# Patient Record
Sex: Male | Born: 1935 | Race: White | Hispanic: No | Smoking: Former smoker
Health system: Southern US, Community
[De-identification: ages and names within clinical notes are randomized; demographics above are authoritative.]

## PROBLEM LIST (undated history)

## (undated) DIAGNOSIS — I639 Cerebral infarction, unspecified: Secondary | ICD-10-CM

## (undated) DIAGNOSIS — E538 Deficiency of other specified B group vitamins: Secondary | ICD-10-CM

## (undated) DIAGNOSIS — I499 Cardiac arrhythmia, unspecified: Secondary | ICD-10-CM

## (undated) DIAGNOSIS — I10 Essential (primary) hypertension: Secondary | ICD-10-CM

## (undated) DIAGNOSIS — M4800 Spinal stenosis, site unspecified: Secondary | ICD-10-CM

## (undated) DIAGNOSIS — K5790 Diverticulosis of intestine, part unspecified, without perforation or abscess without bleeding: Secondary | ICD-10-CM

## (undated) DIAGNOSIS — I714 Abdominal aortic aneurysm, without rupture, unspecified: Secondary | ICD-10-CM

## (undated) DIAGNOSIS — I509 Heart failure, unspecified: Secondary | ICD-10-CM

## (undated) DIAGNOSIS — E119 Type 2 diabetes mellitus without complications: Secondary | ICD-10-CM

## (undated) HISTORY — PX: CHOLECYSTECTOMY: SHX55

## (undated) HISTORY — PX: BACK SURGERY: SHX140

## (undated) HISTORY — DX: Diverticulosis of intestine, part unspecified, without perforation or abscess without bleeding: K57.90

## (undated) HISTORY — DX: Cardiac arrhythmia, unspecified: I49.9

## (undated) HISTORY — DX: Spinal stenosis, site unspecified: M48.00

## (undated) HISTORY — DX: Abdominal aortic aneurysm, without rupture, unspecified: I71.40

## (undated) HISTORY — DX: Heart failure, unspecified: I50.9

## (undated) HISTORY — DX: Deficiency of other specified B group vitamins: E53.8

---

## 2006-02-01 ENCOUNTER — Ambulatory Visit: Payer: Self-pay | Admitting: Unknown Physician Specialty

## 2006-04-15 ENCOUNTER — Ambulatory Visit: Payer: Self-pay | Admitting: Gastroenterology

## 2007-01-24 ENCOUNTER — Other Ambulatory Visit: Payer: Self-pay

## 2007-01-24 ENCOUNTER — Ambulatory Visit: Payer: Self-pay | Admitting: Orthopedic Surgery

## 2007-01-31 ENCOUNTER — Ambulatory Visit: Payer: Self-pay | Admitting: Orthopedic Surgery

## 2007-02-15 ENCOUNTER — Ambulatory Visit: Payer: Self-pay | Admitting: Orthopedic Surgery

## 2010-02-10 ENCOUNTER — Ambulatory Visit: Payer: Self-pay | Admitting: Unknown Physician Specialty

## 2010-03-27 ENCOUNTER — Ambulatory Visit: Payer: Self-pay | Admitting: Anesthesiology

## 2010-04-30 ENCOUNTER — Ambulatory Visit: Payer: Self-pay | Admitting: Anesthesiology

## 2010-06-30 ENCOUNTER — Ambulatory Visit: Payer: Self-pay | Admitting: Anesthesiology

## 2010-07-24 ENCOUNTER — Ambulatory Visit: Payer: Self-pay | Admitting: Anesthesiology

## 2010-08-21 ENCOUNTER — Ambulatory Visit: Payer: Self-pay | Admitting: Anesthesiology

## 2011-04-13 ENCOUNTER — Ambulatory Visit: Payer: Self-pay | Admitting: Surgery

## 2011-06-09 ENCOUNTER — Ambulatory Visit: Payer: Self-pay | Admitting: Specialist

## 2011-06-14 ENCOUNTER — Ambulatory Visit: Payer: Self-pay | Admitting: Specialist

## 2011-06-16 LAB — PATHOLOGY REPORT

## 2012-06-20 ENCOUNTER — Ambulatory Visit: Payer: Self-pay | Admitting: Ophthalmology

## 2012-06-20 LAB — POTASSIUM: Potassium: 3.6 mmol/L (ref 3.5–5.1)

## 2012-07-04 ENCOUNTER — Ambulatory Visit: Payer: Self-pay | Admitting: Ophthalmology

## 2012-08-10 ENCOUNTER — Ambulatory Visit: Payer: Self-pay | Admitting: Ophthalmology

## 2012-08-22 ENCOUNTER — Ambulatory Visit: Payer: Self-pay | Admitting: Ophthalmology

## 2013-04-12 ENCOUNTER — Ambulatory Visit: Payer: Self-pay

## 2013-05-08 ENCOUNTER — Ambulatory Visit: Payer: Self-pay | Admitting: Gastroenterology

## 2013-05-10 LAB — PATHOLOGY REPORT

## 2013-10-04 ENCOUNTER — Ambulatory Visit: Payer: Self-pay

## 2015-02-18 NOTE — Op Note (Signed)
PATIENT NAME:  Ivan Lewis, Ivan Lewis MR#:  [id number] DATE OF BIRTH:  Mar 30, 1935  DATE OF PROCEDURE:  08/22/2012  PREOPERATIVE DIAGNOSIS: Visually significant cataract of the right eye.   POSTOPERATIVE DIAGNOSIS: Visually significant cataract of the right eye.   OPERATIVE PROCEDURE: Cataract extraction by phacoemulsification with implant of intraocular lens to right eye.   SURGEON: Galen ManilaWilliam Haliey Romberg, MD.   ANESTHESIA:  1. Managed anesthesia care.  2. Topical tetracaine drops followed by 2% Xylocaine jelly applied in the preoperative holding area.   COMPLICATIONS: None.   TECHNIQUE:  Stop and chop.   DESCRIPTION OF PROCEDURE: The patient was examined and consented in the preoperative holding area where the aforementioned topical anesthesia was applied to the right eye and then brought back to the Operating Room where the right eye was prepped and draped in the usual sterile ophthalmic fashion and a lid speculum was placed. A paracentesis was created with the side port blade and the anterior chamber was filled with viscoelastic. A near clear corneal incision was performed with the steel keratome. A continuous curvilinear capsulorrhexis was performed with a cystotome followed by the capsulorrhexis forceps. Hydrodissection and hydrodelineation were carried out with BSS on a blunt cannula. The lens was removed in a stop and chop technique and the remaining cortical material was removed with the irrigation-aspiration handpiece. The capsular bag was inflated with viscoelastic and the Tecnis ZCB00 20.5-diopter lens, serial number [id number] was placed in the capsular bag without complication. The remaining viscoelastic was removed from the eye with the irrigation-aspiration handpiece. The wounds were hydrated. The anterior chamber was flushed with Miostat and the eye was inflated to physiologic pressure. The wounds were found to be water tight. The eye was dressed with Vigamox. The patient was given protective  glasses to wear throughout the day and a shield with which to sleep tonight. The patient was also given drops with which to begin a drop regimen today and will follow-up with me in one day.  ____________________________ Jerilee FieldWilliam L. Raynelle Fujikawa, MD wlp:slb Lewis: 08/22/2012 18:30:00 ET T: 08/23/2012 07:27:51 ET JOB#: [id number]  cc: Kenniel Bergsma L. Jerrie Schussler, MD, <Dictator> Jerilee FieldWILLIAM L Nataline Basara MD ELECTRONICALLY SIGNED 08/23/2012 15:17

## 2015-02-18 NOTE — Op Note (Signed)
PATIENT NAME:  Ivan HeringDONLEY, [name] MR#:  [id number] DATE OF BIRTH:  Oct 20, 1935  DATE OF PROCEDURE:  07/04/2012  PREOPERATIVE DIAGNOSIS: Visually significant cataract of the left eye.   POSTOPERATIVE DIAGNOSIS: Visually significant cataract of the left eye.   OPERATIVE PROCEDURE: Cataract extraction by phacoemulsification with implant of intraocular lens to left eye.   SURGEON: Galen ManilaWilliam Illianna Paschal, MD.   ANESTHESIA:  1. Managed anesthesia care.  2. Topical tetracaine drops followed by 2% Xylocaine jelly applied in the preoperative holding area.   COMPLICATIONS: None.   TECHNIQUE:  Stop and chop.   DESCRIPTION OF PROCEDURE: The patient was examined and consented in the preoperative holding area where the aforementioned topical anesthesia was applied to the left eye and then brought back to the Operating Room where the left eye was prepped and draped in the usual sterile ophthalmic fashion and a lid speculum was placed. A paracentesis was created with the side port blade and the anterior chamber was filled with viscoelastic. A near clear corneal incision was performed with the steel keratome. A continuous curvilinear capsulorrhexis was performed with a cystotome followed by the capsulorrhexis forceps. Hydrodissection and hydrodelineation were carried out with BSS on a blunt cannula. The lens was removed in a stop and chop technique and the remaining cortical material was removed with the irrigation-aspiration handpiece. The capsular bag was inflated with viscoelastic and the Technis ZCB00 20.5-diopter lens, serial number [id number] was placed in the capsular bag without complication. The remaining viscoelastic was removed from the eye with the irrigation-aspiration handpiece. The wounds were hydrated. The anterior chamber was flushed with Miostat and the eye was inflated to physiologic pressure. The wounds were found to be water tight. The eye was dressed with Vigamox. The patient was given protective  glasses to wear throughout the day and a shield with which to sleep tonight. The patient was also given drops with which to begin a drop regimen today and will follow-up with me in one day.   ____________________________ Jerilee FieldWilliam L. Idan Prime, MD wlp:cms D: 07/04/2012 12:24:48 ET T: 07/04/2012 12:45:56 ET JOB#: [id number]  cc: Louanna Vanliew L. Jazia Faraci, MD, <Dictator> Jerilee FieldWILLIAM L Dandra Shambaugh MD ELECTRONICALLY SIGNED 07/05/2012 13:06

## 2015-08-17 ENCOUNTER — Encounter: Payer: Self-pay | Admitting: Emergency Medicine

## 2015-08-17 ENCOUNTER — Emergency Department: Payer: Medicare Other

## 2015-08-17 ENCOUNTER — Emergency Department
Admission: EM | Admit: 2015-08-17 | Discharge: 2015-08-17 | Disposition: A | Discharge status: Home / Self Care | Payer: Medicare Other | Attending: Emergency Medicine | Admitting: Emergency Medicine

## 2015-08-17 DIAGNOSIS — J9801 Acute bronchospasm: Secondary | ICD-10-CM | POA: Insufficient documentation

## 2015-08-17 DIAGNOSIS — E119 Type 2 diabetes mellitus without complications: Secondary | ICD-10-CM | POA: Insufficient documentation

## 2015-08-17 DIAGNOSIS — R911 Solitary pulmonary nodule: Secondary | ICD-10-CM | POA: Diagnosis not present

## 2015-08-17 DIAGNOSIS — I10 Essential (primary) hypertension: Secondary | ICD-10-CM | POA: Diagnosis not present

## 2015-08-17 DIAGNOSIS — J181 Lobar pneumonia, unspecified organism: Secondary | ICD-10-CM | POA: Insufficient documentation

## 2015-08-17 DIAGNOSIS — J189 Pneumonia, unspecified organism: Secondary | ICD-10-CM

## 2015-08-17 DIAGNOSIS — R918 Other nonspecific abnormal finding of lung field: Secondary | ICD-10-CM

## 2015-08-17 DIAGNOSIS — Z87891 Personal history of nicotine dependence: Secondary | ICD-10-CM | POA: Diagnosis not present

## 2015-08-17 DIAGNOSIS — Z880 Allergy status to penicillin: Secondary | ICD-10-CM | POA: Insufficient documentation

## 2015-08-17 DIAGNOSIS — R05 Cough: Secondary | ICD-10-CM | POA: Diagnosis present

## 2015-08-17 HISTORY — DX: Essential (primary) hypertension: I10

## 2015-08-17 HISTORY — DX: Cerebral infarction, unspecified: I63.9

## 2015-08-17 HISTORY — DX: Type 2 diabetes mellitus without complications: E11.9

## 2015-08-17 LAB — CBC WITH DIFFERENTIAL/PLATELET
BASOS PCT: 1 %
Basophils Absolute: 0.1 10*3/uL (ref 0–0.1)
Eosinophils Absolute: 0.1 10*3/uL (ref 0–0.7)
Eosinophils Relative: 0 %
HEMATOCRIT: 39.9 % — AB (ref 40.0–52.0)
HEMOGLOBIN: 13.5 g/dL (ref 13.0–18.0)
Lymphocytes Relative: 9 %
Lymphs Abs: 1.6 10*3/uL (ref 1.0–3.6)
MCH: 29.8 pg (ref 26.0–34.0)
MCHC: 33.9 g/dL (ref 32.0–36.0)
MCV: 87.9 fL (ref 80.0–100.0)
Monocytes Absolute: 1.2 10*3/uL — ABNORMAL HIGH (ref 0.2–1.0)
Monocytes Relative: 7 %
NEUTROS ABS: 14.4 10*3/uL — AB (ref 1.4–6.5)
NEUTROS PCT: 83 %
Platelets: 263 10*3/uL (ref 150–440)
RBC: 4.54 MIL/uL (ref 4.40–5.90)
RDW: 13.7 % (ref 11.5–14.5)
WBC: 17.3 10*3/uL — AB (ref 3.8–10.6)

## 2015-08-17 LAB — BASIC METABOLIC PANEL
ANION GAP: 9 (ref 5–15)
BUN: 15 mg/dL (ref 6–20)
CALCIUM: 9.2 mg/dL (ref 8.9–10.3)
CO2: 30 mmol/L (ref 22–32)
Chloride: 95 mmol/L — ABNORMAL LOW (ref 101–111)
Creatinine, Ser: 0.77 mg/dL (ref 0.61–1.24)
GFR calc non Af Amer: >60 mL/min (ref >60)
Glucose, Bld: 135 mg/dL — ABNORMAL HIGH (ref 65–99)
Potassium: 3.6 mmol/L (ref 3.5–5.1)
SODIUM: 134 mmol/L — AB (ref 135–145)

## 2015-08-17 MED ORDER — ALBUTEROL SULFATE HFA 108 (90 BASE) MCG/ACT IN AERS: 2.0000 | INHALATION_SPRAY | Freq: Four times a day (QID) | RESPIRATORY_TRACT | Status: DC | PRN

## 2015-08-17 MED ORDER — AZITHROMYCIN 250 MG PO TABS
500.0000 mg | ORAL_TABLET | Freq: Once | ORAL | Status: AC
  Administered 2015-08-17: 500 mg via ORAL

## 2015-08-17 MED ORDER — AZITHROMYCIN 250 MG PO TABS
ORAL_TABLET | ORAL | Status: AC
  Administered 2015-08-17: 500 mg via ORAL
  Filled 2015-08-17: qty 2

## 2015-08-17 MED ORDER — DEXTROSE 5 % IV SOLN
1.0000 g | Freq: Once | INTRAVENOUS | Status: AC
  Administered 2015-08-17: 1 g via INTRAVENOUS
  Filled 2015-08-17 (×2): qty 10

## 2015-08-17 MED ORDER — IOHEXOL 300 MG/ML  SOLN
75.0000 mL | Freq: Once | INTRAMUSCULAR | Status: AC | PRN
  Administered 2015-08-17: 75 mL via INTRAVENOUS
  Filled 2015-08-17: qty 75

## 2015-08-17 MED ORDER — IPRATROPIUM-ALBUTEROL 0.5-2.5 (3) MG/3ML IN SOLN
3.0000 mL | Freq: Once | RESPIRATORY_TRACT | Status: AC
  Administered 2015-08-17: 3 mL via RESPIRATORY_TRACT
  Filled 2015-08-17 (×2): qty 3

## 2015-08-17 MED ORDER — AZITHROMYCIN 250 MG PO TABS: ORAL_TABLET | ORAL | Status: DC

## 2015-08-17 NOTE — Discharge Instructions (Signed)
Your found to have a left-sided pneumonia, and were given a 24-hour dose of Rocephin antibiotic IV in the emergency department. You're also given your first dose of azithromycin here in the emergency department. Start your additional azithromycin tablets tomorrow. You're being treated with albuterol inhaler for wheezing, as needed.  Return to the emergency department for any worsening condition including trouble breathing, chest pain, confusion or altered mental status, or any other symptoms concerning to you.  We discussed your CT scan showed 2 right sided small pulmonary nodules, which may need a repeat CT scan in several months. Discuss this with your primary care physician.   Community-Acquired Pneumonia, Adult Pneumonia is an infection of the lungs. There are different types of pneumonia. One type can develop while a person is in a hospital. A different type, called community-acquired pneumonia, develops in people who are not, or have not recently been, in the hospital or other health care facility.  CAUSES Pneumonia may be caused by bacteria, viruses, or funguses. Community-acquired pneumonia is often caused by Streptococcus pneumonia bacteria. These bacteria are often passed from one person to another by breathing in droplets from the cough or sneeze of an infected person. RISK FACTORS The condition is more likely to develop in:  People who havechronic diseases, such as chronic obstructive pulmonary disease (COPD), asthma, congestive heart failure, cystic fibrosis, diabetes, or kidney disease.  People who haveearly-stage or late-stage HIV.  People who havesickle cell disease.  People who havehad their spleen removed (splenectomy).  People who havepoor Administrator.  People who havemedical conditions that increase the risk of breathing in (aspirating) secretions their own mouth and nose.   People who havea weakened immune system (immunocompromised).  People who  smoke.  People whotravel to areas where pneumonia-causing germs commonly exist.  People whoare around animal habitats or animals that have pneumonia-causing germs, including birds, bats, rabbits, cats, and farm animals. SYMPTOMS Symptoms of this condition include:  Adry cough.  A wet (productive) cough.  Fever.  Sweating.  Chest pain, especially when breathing deeply or coughing.  Rapid breathing or difficulty breathing.  Shortness of breath.  Shaking chills.  Fatigue.  Muscle aches. DIAGNOSIS Your health care provider will take a medical history and perform a physical exam. You may also have other tests, including:  Imaging studies of your chest, including X-rays.  Tests to check your blood oxygen level and other blood gases.  Other tests on blood, mucus (sputum), fluid around your lungs (pleural fluid), and urine. If your pneumonia is severe, other tests may be done to identify the specific cause of your illness. TREATMENT The type of treatment that you receive depends on many factors, such as the cause of your pneumonia, the medicines you take, and other medical conditions that you have. For most adults, treatment and recovery from pneumonia may occur at home. In some cases, treatment must happen in a hospital. Treatment may include:  Antibiotic medicines, if the pneumonia was caused by bacteria.  Antiviral medicines, if the pneumonia was caused by a virus.  Medicines that are given by mouth or through an IV tube.  Oxygen.  Respiratory therapy. Although rare, treating severe pneumonia may include:  Mechanical ventilation. This is done if you are not breathing well on your own and you cannot maintain a safe blood oxygen level.  Thoracentesis. This procedureremoves fluid around one lung or both lungs to help you breathe better. HOME CARE INSTRUCTIONS  Take over-the-counter and prescription medicines only as told by your health  care provider.  Only  takecough medicine if you are losing sleep. Understand that cough medicine can prevent your body's natural ability to remove mucus from your lungs.  If you were prescribed an antibiotic medicine, take it as told by your health care provider. Do not stop taking the antibiotic even if you start to feel better.  Sleep in a semi-upright position at night. Try sleeping in a reclining chair, or place a few pillows under your head.  Do not use tobacco products, including cigarettes, chewing tobacco, and e-cigarettes. If you need help quitting, ask your health care provider.  Drink enough water to keep your urine clear or pale yellow. This will help to thin out mucus secretions in your lungs. PREVENTION There are ways that you can decrease your risk of developing community-acquired pneumonia. Consider getting a pneumococcal vaccine if:  You are older than 78 years of age.  You are older than 78 years of age and are undergoing cancer treatment, have chronic lung disease, or have other medical conditions that affect your immune system. Ask your health care provider if this applies to you. There are different types and schedules of pneumococcal vaccines. Ask your health care provider which vaccination option is best for you. You may also prevent community-acquired pneumonia if you take these actions:  Get an influenza vaccine every year. Ask your health care provider which type of influenza vaccine is best for you.  Go to the dentist on a regular basis.  Wash your hands often. Use hand sanitizer if soap and water are not available. SEEK MEDICAL CARE IF:  You have a fever.  You are losing sleep because you cannot control your cough with cough medicine. SEEK IMMEDIATE MEDICAL CARE IF:  You have worsening shortness of breath.  You have increased chest pain.  Your sickness becomes worse, especially if you are an older adult or have a weakened immune system.  You cough up blood.   This  information is not intended to replace advice given to you by your health care provider. Make sure you discuss any questions you have with your health care provider.   Document Released: 10/18/2005 Document Revised: 07/09/2015 Document Reviewed: 02/12/2015 Elsevier Interactive Patient Education 2016 [employer]   Bronchospasm, Adult A bronchospasm is a spasm or tightening of the airways going into the lungs. During a bronchospasm breathing becomes more difficult because the airways get smaller. When this happens there can be coughing, a whistling sound when breathing (wheezing), and difficulty breathing. Bronchospasm is often associated with asthma, but not all patients who experience a bronchospasm have asthma. CAUSES  A bronchospasm is caused by inflammation or irritation of the airways. The inflammation or irritation may be triggered by:   Allergies (such as to animals, pollen, food, or mold). Allergens that cause bronchospasm may cause wheezing immediately after exposure or many hours later.   Infection. Viral infections are believed to be the most common cause of bronchospasm.   Exercise.   Irritants (such as pollution, cigarette smoke, strong odors, aerosol sprays, and paint fumes).   Weather changes. Winds increase molds and pollens in the air. Rain refreshes the air by washing irritants out. Cold air may cause inflammation.   Stress and emotional upset.  SIGNS AND SYMPTOMS   Wheezing.   Excessive nighttime coughing.   Frequent or severe coughing with a simple cold.   Chest tightness.   Shortness of breath.  DIAGNOSIS  Bronchospasm is usually diagnosed through a history and physical exam. Tests,  such as chest X-rays, are sometimes done to look for other conditions. TREATMENT   Inhaled medicines can be given to open up your airways and help you breathe. The medicines can be given using either an inhaler or a nebulizer machine.  Corticosteroid medicines may  be given for severe bronchospasm, usually when it is associated with asthma. HOME CARE INSTRUCTIONS   Always have a plan prepared for seeking medical care. Know when to call your health care provider and local emergency services (911 in the [place] Know where you can access local emergency care.  Only take medicines as directed by your health care provider.  If you were prescribed an inhaler or nebulizer machine, ask your health care provider to explain how to use it correctly. Always use a spacer with your inhaler if you were given one.  It is necessary to remain calm during an attack. Try to relax and breathe more slowly.  Control your home environment in the following ways:   Change your heating and air conditioning filter at least once a month.   Limit your use of fireplaces and wood stoves.  Do not smoke and do not allow smoking in your home.   Avoid exposure to perfumes and fragrances.   Get rid of pests (such as roaches and mice) and their droppings.   Throw away plants if you see mold on them.   Keep your house clean and dust free.   Replace carpet with wood, tile, or vinyl flooring. Carpet can trap dander and dust.   Use allergy-proof pillows, mattress covers, and box spring covers.   Wash bed sheets and blankets every week in hot water and dry them in a dryer.   Use blankets that are made of polyester or cotton.   Wash hands frequently. SEEK MEDICAL CARE IF:   You have muscle aches.   You have chest pain.   The sputum changes from clear or white to yellow, green, gray, or bloody.   The sputum you cough up gets thicker.   There are problems that may be related to the medicine you are given, such as a rash, itching, swelling, or trouble breathing.  SEEK IMMEDIATE MEDICAL CARE IF:   You have worsening wheezing and coughing even after taking your prescribed medicines.   You have increased difficulty breathing.   You develop severe chest  pain. MAKE SURE YOU:   Understand these instructions.  Will watch your condition.  Will get help right away if you are not doing well or get worse.   This information is not intended to replace advice given to you by your health care provider. Make sure you discuss any questions you have with your health care provider.   Document Released: 10/21/2003 Document Revised: 11/08/2014 Document Reviewed: 04/09/2013 Elsevier Interactive Patient Education 2016 [employer]   Pulmonary Nodule A pulmonary nodule is a small, round growth of tissue in the lung. Pulmonary nodules can range in size from less than 1/5 inch (4 mm) to a little bigger than an inch (25 mm). Most pulmonary nodules are detected when imaging tests of the lung are being performed for a different problem. Pulmonary nodules are usually not cancerous (benign). However, some pulmonary nodules are cancerous (malignant). Follow-up treatment or testing is based on the size of the pulmonary nodule and your risk of getting lung cancer.  CAUSES Benign pulmonary nodules can be caused by various things. Some of the causes include:   Bacterial, fungal, or viral infections. This is usually  an old infection that is no longer active, but it can sometimes be a current, active infection.  A benign mass of tissue.  Inflammation from conditions such as rheumatoid arthritis.   Abnormal blood vessels in the lungs. Malignant pulmonary nodules can result from lung cancer or from cancers that spread to the lung from other places in the body. SIGNS AND SYMPTOMS Pulmonary nodules usually do not cause symptoms. DIAGNOSIS Most often, pulmonary nodules are found incidentally when an X-ray or CT scan is performed to look for some other problem in the lung area. To help determine whether a pulmonary nodule is benign or malignant, your health care provider will take a medical history and order a variety of tests. Tests done may include:   Blood  tests.  A skin test called a tuberculin test. This test is used to determine if you have been exposed to the germ that causes tuberculosis.   Chest X-rays. If possible, a new X-ray may be compared with X-rays you have had in the past.   CT scan. This test shows smaller pulmonary nodules more clearly than an X-ray.   Positron emission tomography (PET) scan. In this test, a safe amount of a radioactive substance is injected into the bloodstream. Then, the scan takes a picture of the pulmonary nodule. The radioactive substance is eliminated from your body in your urine.   Biopsy. A tiny piece of the pulmonary nodule is removed so it can be checked under a microscope. TREATMENT  Pulmonary nodules that are benign normally do not require any treatment because they usually do not cause symptoms or breathing problems. Your health care provider may want to monitor the pulmonary nodule through follow-up CT scans. The frequency of these CT scans will vary based on the size of the nodule and the risk factors for lung cancer. For example, CT scans will need to be done more frequently if the pulmonary nodule is larger and if you have a history of smoking and a family history of cancer. Further testing or biopsies may be done if any follow-up CT scan shows that the size of the pulmonary nodule has increased. HOME CARE INSTRUCTIONS  Only take over-the-counter or prescription medicines as directed by your health care provider.  Keep all follow-up appointments with your health care provider. SEEK MEDICAL CARE IF:  You have trouble breathing when you are active.   You feel sick or unusually tired.   You do not feel like eating.   You lose weight without trying to.   You develop chills or night sweats.  SEEK IMMEDIATE MEDICAL CARE IF:  You cannot catch your breath, or you begin wheezing.   You cannot stop coughing.   You cough up blood.   You become dizzy or feel like you are going to  pass out.   You have sudden chest pain.   You have a fever or persistent symptoms for more than 2-3 days.   You have a fever and your symptoms suddenly get worse. MAKE SURE YOU:  Understand these instructions.  Will watch your condition.  Will get help right away if you are not doing well or get worse.   This information is not intended to replace advice given to you by your health care provider. Make sure you discuss any questions you have with your health care provider.   Document Released: 08/15/2009 Document Revised: 06/20/2013 Document Reviewed: 04/09/2013 Elsevier Interactive Patient Education [employer]

## 2015-08-17 NOTE — ED Notes (Signed)
Patient transported to CT 

## 2015-08-17 NOTE — ED Provider Notes (Signed)
[facility] Emergency Department Provider Note   ____________________________________________  Time seen: 2:05 PM I have reviewed the triage vital signs and the triage nursing note.  HISTORY  Chief Complaint Cough and Fever   Historian Patient and spouse  HPI Ivan SONTAG Sr. is a 80 y.o. male who is here for evaluation for congestion for several days, followed by chills at church today associated with some wheezing. He does not have a history of lung disease, asthma, or COPD. He has not had a fever that he has a, however did experience chills. Mild nausea, but no vomiting or diarrhea. No abdominal pain. Mild headache. Occasional left lower extremity edema, none now. No chest pain. mild shortness of breath.    Past Medical History  Diagnosis Date  . Diabetes mellitus without complication (HCC)   . Stroke (HCC)   . Hypertension     There are no active problems to display for this patient.   Past Surgical History  Procedure Laterality Date  . Cholecystectomy      Current Outpatient Rx  Name  Route  Sig  Dispense  Refill  . albuterol (PROVENTIL HFA;VENTOLIN HFA) 108 (90 BASE) MCG/ACT inhaler   Inhalation   Inhale 2 puffs into the lungs every 6 (six) hours as needed for wheezing or shortness of breath.   1 Inhaler   0   . azithromycin (ZITHROMAX) 250 MG tablet      One tab once daily for 4 days   4 each   0     Allergies Clindamycin/lincomycin; Erythromycin; Sulfa antibiotics; Penicillins; and Phenylbutazones  No family history on file.  Social History Social History  Substance Use Topics  . Smoking status: Former Games developer  . Smokeless tobacco: None  . Alcohol Use: Yes    Review of Systems  Constitutional: Positive for chills. Negative for fever.. Eyes: Negative for visual changes. ENT: Negative for sore throat. Cardiovascular: Negative for chest pain. Respiratory: Positive cough.. Gastrointestinal: Negative for abdominal  pain, vomiting and diarrhea. Genitourinary: Negative for dysuria. Musculoskeletal: Negative for back pain. Skin: Negative for rash. Neurological: Negative for confusion. 10 point Review of Systems otherwise negative ____________________________________________   PHYSICAL EXAM:  VITAL SIGNS: ED Triage Vitals  Enc Vitals Group     BP 08/17/15 1413 157/90 mmHg     Pulse Rate 08/17/15 1413 93     Resp 08/17/15 1413 18     Temp 08/17/15 1413 99.1 F (37.3 C)     Temp Source 08/17/15 1413 Oral     SpO2 08/17/15 1413 92 %     Weight 08/17/15 1413 220 lb (99.791 kg)     Height 08/17/15 1413  (1.727 m)     Head Cir --      Peak Flow --      Pain Score --      Pain Loc --      Pain Edu? --      Excl. in GC? --      Constitutional: Alert and oriented. Well appearing and in no distress. Eyes: Conjunctivae are normal. PERRL. Normal extraocular movements. ENT   Head: Normocephalic and atraumatic.   Nose: No congestion/rhinnorhea.   Mouth/Throat: Mucous membranes are moist.   Neck: No stridor. Cardiovascular/Chest: Normal rate, regular rhythm.  No murmurs, rubs, or gallops. Respiratory: Normal respiratory effort without tachypnea nor retractions. Mild and exhalation wheeze anterior and upper posteriorly. No rhonchi.  Gastrointestinal: Soft. No distention, no guarding, no rebound. Nontender   Genitourinary/rectal:Deferred Musculoskeletal:  Nontender with normal range of motion in all extremities. No joint effusions.  No lower extremity tenderness.  No edema. Neurologic:  Normal speech and language. No gross or focal neurologic deficits are appreciated. Skin:  Skin is warm, dry and intact. No rash noted. Psychiatric: Mood and affect are normal. Speech and behavior are normal. Patient exhibits appropriate insight and judgment.  ____________________________________________   EKG I, Governor Rooksebecca Kahlani Graber, MD, the attending physician have personally viewed and interpreted all  ECGs.   No EKG performed ____________________________________________  LABS (pertinent positives/negatives)  Basic metabolic panel significant for sodium 134, chloride 95 and otherwise within normal limits/no acute abnormalities White blood count 17.3 with left shift. Hemoglobin 13.5 and platelet count 263  ____________________________________________  RADIOLOGY All Xrays were viewed by me. Imaging interpreted by Radiologist.  Chest x-ray two-view:   IMPRESSION: 1. 17 mm focal ill-defined area of opacity in the left upper lobe. This could reflect focal infection. A mass is possible. Follow-up chest CT is recommended. 2. No other evidence of acute cardiopulmonary disease.  Chest CT with contrast:  IMPRESSION: 1. Abnormality noted in the left upper lobe on the current chest radiograph reflects an area of irregular left upper lobe opacity most consistent with pneumonia. No discrete mass. 2. Additional lung base opacity is noted that may be due to atelectasis, additional infection or combination. 3. Two small nodules, 1 in the right middle lobe and the other in the right lower lobe. If the patient is at high risk for bronchogenic carcinoma, follow-up chest CT at 1 year is recommended. If the patient is at low risk, no follow-up is needed. This recommendation follows the consensus statement: Guidelines for Management of Small Pulmonary Nodules Detected on CT Scans: A Statement from the Fleischner Society as published in Radiology 2005; 237:395-400. __________________________________________  PROCEDURES  Procedure(s) performed: None  Critical Care performed: None  ____________________________________________   ED COURSE / ASSESSMENT AND PLAN  CONSULTATIONS: None  Pertinent labs & imaging results that were available during my care of the patient were reviewed by me and considered in my medical decision making (see chart for details).   Patient's symptoms  concerning for upper respiratory infection. Chest x-ray was obtained and shows likely pneumonia, however radiologist recommended CT to rule out underlying mass.  Chest CT showed area of concern consistent with pneumonia, and this is clinically consistent with the patient's upper respiratory symptoms which are relatively acute onset, as well as elevated white blood cell count. He's been treated with pneumonia with a dose of Rocephin and azithromycin here in the part. Patient states he has a history of multiple medication allergies. He states his allergy to penicillin caused him a rash, and we discussed other alternatives including Levaquin, however he has not tolerated Cipro in the past. In addition he states he has tolerated a "Z-Pak" in the past.  I discussed the pulmonary nodules, he does have a 20-pack-year history of smoking, although he quit 30 years ago. He will follow up with his primary care physician to discuss follow-up of these pulmonary nodules.  Patient / Family / Caregiver informed of clinical course, medical decision-making process, and agree with plan.   I discussed return precautions, follow-up instructions, and discharged instructions with patient and/or family.  ___________________________________________   FINAL CLINICAL IMPRESSION(S) / ED DIAGNOSES   Final diagnoses:  Bronchospasm  Left upper lobe pneumonia  Pulmonary nodules       Governor Rooksebecca Breigh Annett, MD 08/17/15 [phone]

## 2015-08-17 NOTE — ED Notes (Signed)
States he has had fever.cough and some chest congestion for couple of days

## 2015-08-17 NOTE — ED Notes (Signed)
Pt to ED with c/o cough, congestion, wheezing and fever/chills

## 2016-10-18 ENCOUNTER — Other Ambulatory Visit (INDEPENDENT_AMBULATORY_CARE_PROVIDER_SITE_OTHER): Payer: Self-pay

## 2016-10-18 ENCOUNTER — Ambulatory Visit (INDEPENDENT_AMBULATORY_CARE_PROVIDER_SITE_OTHER): Payer: Self-pay | Admitting: Vascular Surgery

## 2016-12-16 ENCOUNTER — Other Ambulatory Visit (INDEPENDENT_AMBULATORY_CARE_PROVIDER_SITE_OTHER): Payer: Self-pay

## 2016-12-16 ENCOUNTER — Other Ambulatory Visit (INDEPENDENT_AMBULATORY_CARE_PROVIDER_SITE_OTHER): Payer: Self-pay | Admitting: Vascular Surgery

## 2016-12-16 ENCOUNTER — Ambulatory Visit (INDEPENDENT_AMBULATORY_CARE_PROVIDER_SITE_OTHER): Payer: Medicare Other

## 2016-12-16 ENCOUNTER — Ambulatory Visit (INDEPENDENT_AMBULATORY_CARE_PROVIDER_SITE_OTHER): Payer: Medicare Other | Admitting: Vascular Surgery

## 2016-12-16 DIAGNOSIS — K219 Gastro-esophageal reflux disease without esophagitis: Secondary | ICD-10-CM | POA: Insufficient documentation

## 2016-12-16 DIAGNOSIS — I714 Abdominal aortic aneurysm, without rupture, unspecified: Secondary | ICD-10-CM

## 2016-12-16 DIAGNOSIS — I10 Essential (primary) hypertension: Secondary | ICD-10-CM | POA: Insufficient documentation

## 2016-12-16 NOTE — Progress Notes (Signed)
MRN : [id number]  Ivan VASUDEVAN Sr. is a 82 y.o. (1935-06-08) male who presents with chief complaint of  Chief Complaint  Patient presents with  . AAA    1 year ultrasound follow up  .  History of Present Illness:  The patient presents to the office for evaluation of an abdominal aortic aneurysm. Patient denies abdominal pain or back pain, no other abdominal complaints.  Patient denies amaurosis fugax or TIA symptoms. There is no history of claudication or rest pain symptoms of the lower extremities. The patient denies angina or shortness of breath.  Duplex US of the aorta and iliac arteries today shows AAA 3.4 cm  Compared to the study dated 04/12/2013 shows an AAA sac measures 3.3  Current Meds  Medication Sig  . hydrochlorothiazide (HYDRODIURIL) 25 MG tablet   . losartan (COZAAR) 100 MG tablet Take by mouth.  . metoprolol tartrate (LOPRESSOR) 25 MG tablet   . omeprazole (PRILOSEC) 40 MG capsule   . vitamin B-12 (CYANOCOBALAMIN) 1000 MCG tablet Take by mouth.    Past Medical History:  Diagnosis Date  . Diabetes mellitus without complication (HCC)   . Hypertension   . Stroke [facility])     Past Surgical History:  Procedure Laterality Date  . CHOLECYSTECTOMY      Social History Social History  Substance Use Topics  . Smoking status: Former Games developer  . Smokeless tobacco: Not on file  . Alcohol use Yes    Family History No family history on file. No family history of bleeding/clotting disorders, porphyria or autoimmune disease   Allergies  Allergen Reactions  . Clindamycin/Lincomycin Other (See Comments)    unknown  . Enalapril Maleate     Other reaction(s): Cough  . Erythromycin Other (See Comments)    unknown  . Sulfa Antibiotics Other (See Comments)    unknown  . Penicillins Rash  . Phenobarbital Rash  . Phenylbutazones Rash     REVIEW OF SYSTEMS (Negative unless checked)  Constitutional: [] Weight loss  [] Fever  [] Chills Cardiac: [] Chest pain    [] Chest pressure   [] Palpitations   [] Shortness of breath when laying flat   [] Shortness of breath with exertion. Vascular:  [] Pain in legs with walking   [] Pain in legs at rest  [] History of DVT   [] Phlebitis   [x] Swelling in legs   [] Varicose veins   [] Non-healing ulcers Pulmonary:   [] Uses home oxygen   [] Productive cough   [] Hemoptysis   [] Wheeze  [] COPD   [] Asthma Neurologic:  [] Dizziness   [] Seizures   [] History of stroke   [] History of TIA  [] Aphasia   [] Vissual changes   [] Weakness or numbness in arm   [] Weakness or numbness in leg Musculoskeletal:   [] Joint swelling   [x] Joint pain   [] Low back pain Hematologic:  [] Easy bruising  [] Easy bleeding   [] Hypercoagulable state   [] Anemic Gastrointestinal:  [] Diarrhea   [] Vomiting  [] Gastroesophageal reflux/heartburn   [] Difficulty swallowing. Genitourinary:  [] Chronic kidney disease   [] Difficult urination  [] Frequent urination   [] Blood in urine Skin:  [] Rashes   [] Ulcers  Psychological:  [] History of anxiety   []  History of major depression.  Physical Examination  Vitals:   12/16/16 0910  BP: (!) 173/82  Pulse: (!) 48  Resp: 16  Weight: 230 lb (104.3 kg)  Height: 5' 8.5" (1.74 m)   Body mass index is 34.46 kg/m. Gen: WD/WN, NAD Head: [place]/AT, No temporalis wasting.  Ear/Nose/Throat: Hearing grossly intact, nares w/o erythema or  drainage, poor dentition Eyes: PER, EOMI, sclera nonicteric.  Neck: Supple, no masses.  No bruit or JVD.  Pulmonary:  Good air movement, clear to auscultation bilaterally, no use of accessory muscles.  Cardiac: RRR, normal S1, S2, no Murmurs. Vascular:  Vessel Right Left  Radial Palpable Palpable  Ulnar Palpable Palpable  Brachial Palpable Palpable  Carotid Palpable Palpable  Femoral Palpable Palpable  Popliteal Palpable Palpable  PT Palpable Palpable  DP Palpable Palpable   Gastrointestinal: soft, non-distended. No guarding/no peritoneal signs.  Musculoskeletal: M/S 5/5 throughout.  No deformity  or atrophy.  Neurologic: CN 2-12 intact. Pain and light touch intact in extremities.  Symmetrical.  Speech is fluent. Motor exam as listed above. Psychiatric: Judgment intact, Mood & affect appropriate for pt's clinical situation. Dermatologic: No rashes or ulcers noted.  No changes consistent with cellulitis. Lymph : No Cervical lymphadenopathy, no lichenification or skin changes of chronic lymphedema.  CBC Lab Results  Component Value Date   WBC 17.3 (H) 08/17/2015   HGB 13.5 08/17/2015   HCT 39.9 (L) 08/17/2015   MCV 87.9 08/17/2015   PLT 263 08/17/2015    BMET    Component Value Date/Time   NA 134 (L) 08/17/2015 1442   K 3.6 08/17/2015 1442   K 4.0 08/10/2012 1158   CL 95 (L) 08/17/2015 1442   CO2 30 08/17/2015 1442   GLUCOSE 135 (H) 08/17/2015 1442   BUN 15 08/17/2015 1442   CREATININE 0.77 08/17/2015 1442   CALCIUM 9.2 08/17/2015 1442   GFRNONAA >60 08/17/2015 1442   GFRAA >60 08/17/2015 1442   CrCl cannot be calculated (Patient's most recent lab result is older than the maximum 21 days allowed.).  COAG No results found for: INR, PROTIME  Radiology No results found.  Assessment/Plan 1. AAA (abdominal aortic aneurysm) without rupture (HCC) No surgery or intervention at this time. The patient has an asymptomatic abdominal aortic aneurysm that is less than 4 cm in maximal diameter.  I have discussed the natural history of abdominal aortic aneurysm and the small risk of rupture for aneurysm less than 5 cm in size.  However, as these small aneurysms tend to enlarge over time, continued surveillance with ultrasound or CT scan is mandatory.  I have also discussed optimizing medical management with hypertension and lipid control and the importance of abstinence from tobacco.  The patient is also encouraged to exercise a minimum of 30 minutes 4 times a week.  Should the patient develop new onset abdominal or back pain or signs of peripheral embolization they are instructed to  seek medical attention immediately and to alert the physician providing care that they have an aneurysm.  The patient voices their understanding. The patient will return in 12 months with an aortic duplex.  2. Essential hypertension Continue antihypertensive medications as already ordered, these medications have been reviewed and there are no changes at this time.   3. Gastroesophageal reflux disease without esophagitis Continue PPI as already ordered, these medications have been reviewed and there are no changes at this time.   Levora DredgeGregory Schnier, MD  12/16/2016 9:32 AM

## 2017-01-27 ENCOUNTER — Other Ambulatory Visit
Admission: RE | Admit: 2017-01-27 | Discharge: 2017-01-27 | Disposition: A | Discharge status: Home / Self Care | Payer: Medicare Other | Source: Ambulatory Visit | Attending: Unknown Physician Specialty | Admitting: Unknown Physician Specialty

## 2017-01-27 DIAGNOSIS — M1711 Unilateral primary osteoarthritis, right knee: Secondary | ICD-10-CM | POA: Insufficient documentation

## 2017-01-27 DIAGNOSIS — M25561 Pain in right knee: Secondary | ICD-10-CM | POA: Insufficient documentation

## 2017-01-27 LAB — SYNOVIAL FLUID, CRYSTAL: Crystals, Fluid: NONE SEEN

## 2017-12-19 ENCOUNTER — Encounter (INDEPENDENT_AMBULATORY_CARE_PROVIDER_SITE_OTHER): Payer: Self-pay | Admitting: Vascular Surgery

## 2017-12-19 ENCOUNTER — Ambulatory Visit (INDEPENDENT_AMBULATORY_CARE_PROVIDER_SITE_OTHER): Payer: Medicare Other | Admitting: Vascular Surgery

## 2017-12-19 ENCOUNTER — Ambulatory Visit (INDEPENDENT_AMBULATORY_CARE_PROVIDER_SITE_OTHER): Payer: Medicare Other

## 2017-12-19 VITALS — BP 157/80 | HR 48 | Resp 14 | Ht 68.5 in | Wt 227.0 lb

## 2017-12-19 DIAGNOSIS — I714 Abdominal aortic aneurysm, without rupture, unspecified: Secondary | ICD-10-CM

## 2017-12-19 DIAGNOSIS — I10 Essential (primary) hypertension: Secondary | ICD-10-CM | POA: Diagnosis not present

## 2017-12-19 DIAGNOSIS — K219 Gastro-esophageal reflux disease without esophagitis: Secondary | ICD-10-CM | POA: Diagnosis not present

## 2017-12-19 NOTE — Progress Notes (Signed)
MRN : [id number]  Ivan HeringGerald D Eggenberger Sr. is a 83 y.o. (April 16, 1935) male who presents with chief complaint of  Chief Complaint  Patient presents with  . Follow-up    1 year aortic iliac u/s  .  History of Present Illness: The patient presents to the office for evaluation of an abdominal aortic aneurysm. Patient denies abdominal pain or back pain, no other abdominal complaints.  Patient denies amaurosis fugax or TIA symptoms. There is no history of claudication or rest pain symptoms of the lower extremities. The patient denies angina or shortness of breath.  Duplex US of the aorta and iliac arteries today shows AAA 3.4 cm  Compared to the study dated 12/16/2016 shows an AAA sac measures 3.4    Current Meds  Medication Sig  . hydrochlorothiazide (HYDRODIURIL) 25 MG tablet   . hydrocortisone (ANUSOL-HC) 2.5 % rectal cream   . losartan (COZAAR) 100 MG tablet Take by mouth.  . metoprolol tartrate (LOPRESSOR) 25 MG tablet   . naproxen sodium (ALEVE) 220 MG tablet Take by mouth.  . Omega-3 Fatty Acids (FISH OIL PO) Take by mouth.  Marland Kitchen. omeprazole (PRILOSEC) 40 MG capsule   . vitamin B-12 (CYANOCOBALAMIN) 1000 MCG tablet Take by mouth.    Past Medical History:  Diagnosis Date  . Diabetes mellitus without complication (HCC)   . Hypertension   . Stroke [facility](HCC)     Past Surgical History:  Procedure Laterality Date  . CHOLECYSTECTOMY      Social History Social History   Tobacco Use  . Smoking status: Former Games developermoker  . Smokeless tobacco: Never Used  Substance Use Topics  . Alcohol use: Yes  . Drug use: Not on file    Family History History reviewed. No pertinent family history.  Allergies  Allergen Reactions  . Clindamycin/Lincomycin Other (See Comments)    unknown  . Enalapril Maleate     Other reaction(s): Cough  . Erythromycin Other (See Comments)    unknown  . Nitrofurantoin     Other reaction(s): Unknown  . Sulfa Antibiotics Other (See Comments)    unknown  .  Penicillins Rash  . Phenobarbital Rash  . Phenylbutazones Rash     REVIEW OF SYSTEMS (Negative unless checked)  Constitutional: [] Weight loss  [] Fever  [] Chills Cardiac: [] Chest pain   [] Chest pressure   [] Palpitations   [] Shortness of breath when laying flat   [] Shortness of breath with exertion. Vascular:  [] Pain in legs with walking   [] Pain in legs at rest  [] History of DVT   [] Phlebitis   [] Swelling in legs   [] Varicose veins   [] Non-healing ulcers Pulmonary:   [] Uses home oxygen   [] Productive cough   [] Hemoptysis   [] Wheeze  [] COPD   [] Asthma Neurologic:  [] Dizziness   [] Seizures   [] History of stroke   [] History of TIA  [] Aphasia   [] Vissual changes   [] Weakness or numbness in arm   [] Weakness or numbness in leg Musculoskeletal:   [] Joint swelling   [] Joint pain   [] Low back pain Hematologic:  [] Easy bruising  [] Easy bleeding   [] Hypercoagulable state   [] Anemic Gastrointestinal:  [] Diarrhea   [] Vomiting  [] Gastroesophageal reflux/heartburn   [] Difficulty swallowing. Genitourinary:  [] Chronic kidney disease   [] Difficult urination  [] Frequent urination   [] Blood in urine Skin:  [] Rashes   [] Ulcers  Psychological:  [] History of anxiety   []  History of major depression.  Physical Examination  Vitals:   12/19/17 1002  BP: (!) 157/80  Pulse: Marland Kitchen(!)  48  Resp: 14  Weight: 227 lb (103 kg)  Height: 5' 8.5" (1.74 m)   Body mass index is 34.01 kg/m. Gen: WD/WN, NAD Head: [place]/AT, No temporalis wasting.  Ear/Nose/Throat: Hearing grossly intact, nares w/o erythema or drainage Eyes: PER, EOMI, sclera nonicteric.  Neck: Supple, no large masses.   Pulmonary:  Good air movement, no audible wheezing bilaterally, no use of accessory muscles.  Cardiac: RRR, no JVD Vascular:  Vessel Right Left  Radial Palpable Palpable  PT Palpable Palpable  DP Palpable Palpable  Gastrointestinal: Non-distended. No guarding/no peritoneal signs.  Musculoskeletal: M/S 5/5 throughout.  No deformity or  atrophy.  Neurologic: CN 2-12 intact. Symmetrical.  Speech is fluent. Motor exam as listed above. Psychiatric: Judgment intact, Mood & affect appropriate for pt's clinical situation. Dermatologic: No rashes or ulcers noted.  No changes consistent with cellulitis. Lymph : No lichenification or skin changes of chronic lymphedema.  CBC Lab Results  Component Value Date   WBC 17.3 (H) 08/17/2015   HGB 13.5 08/17/2015   HCT 39.9 (L) 08/17/2015   MCV 87.9 08/17/2015   PLT 263 08/17/2015    BMET    Component Value Date/Time   NA 134 (L) 08/17/2015 1442   K 3.6 08/17/2015 1442   K 4.0 08/10/2012 1158   CL 95 (L) 08/17/2015 1442   CO2 30 08/17/2015 1442   GLUCOSE 135 (H) 08/17/2015 1442   BUN 15 08/17/2015 1442   CREATININE 0.77 08/17/2015 1442   CALCIUM 9.2 08/17/2015 1442   GFRNONAA >60 08/17/2015 1442   GFRAA >60 08/17/2015 1442   CrCl cannot be calculated (Patient's most recent lab result is older than the maximum 21 days allowed.).  COAG No results found for: INR, PROTIME  Radiology No results found.  Assessment/Plan 1. AAA (abdominal aortic aneurysm) without rupture (HCC) No surgery or intervention at this time. The patient has an asymptomatic abdominal aortic aneurysm that is less than 4 cm in maximal diameter.  I have discussed the natural history of abdominal aortic aneurysm and the small risk of rupture for aneurysm less than 5 cm in size.  However, as these small aneurysms tend to enlarge over time, continued surveillance with ultrasound or CT scan is mandatory.  I have also discussed optimizing medical management with hypertension and lipid control and the importance of abstinence from tobacco.  The patient is also encouraged to exercise a minimum of 30 minutes 4 times a week.  Should the patient develop new onset abdominal or back pain or signs of peripheral embolization they are instructed to seek medical attention immediately and to alert the physician providing care  that they have an aneurysm.  The patient voices their understanding. The patient will return in 12 months with an aortic duplex.  - VAS US AORTA/IVC/ILIACS; Future  2. Essential hypertension Continue antihypertensive medications as already ordered, these medications have been reviewed and there are no changes at this time.   3. Gastroesophageal reflux disease without esophagitis Continue antihypertensive medications as already ordered, these medications have been reviewed and there are no changes at this time.  Avoidence of caffeine and alcohol  Moderate elevation of the head of the bed   Levora Dredge, MD  12/19/2017 10:09 AM

## 2018-12-21 ENCOUNTER — Ambulatory Visit (INDEPENDENT_AMBULATORY_CARE_PROVIDER_SITE_OTHER): Payer: Medicare Other | Admitting: Vascular Surgery

## 2018-12-21 ENCOUNTER — Encounter (INDEPENDENT_AMBULATORY_CARE_PROVIDER_SITE_OTHER): Payer: Self-pay | Admitting: Vascular Surgery

## 2018-12-21 ENCOUNTER — Other Ambulatory Visit (INDEPENDENT_AMBULATORY_CARE_PROVIDER_SITE_OTHER): Payer: Medicare Other

## 2018-12-21 VITALS — BP 177/70 | HR 49 | Resp 16 | Ht 68.0 in | Wt 220.0 lb

## 2018-12-21 DIAGNOSIS — Z87891 Personal history of nicotine dependence: Secondary | ICD-10-CM | POA: Diagnosis not present

## 2018-12-21 DIAGNOSIS — I714 Abdominal aortic aneurysm, without rupture, unspecified: Secondary | ICD-10-CM

## 2018-12-21 DIAGNOSIS — I10 Essential (primary) hypertension: Secondary | ICD-10-CM | POA: Diagnosis not present

## 2018-12-21 DIAGNOSIS — K219 Gastro-esophageal reflux disease without esophagitis: Secondary | ICD-10-CM

## 2018-12-21 NOTE — Progress Notes (Signed)
MRN : [id number]  Ivan WICHT Sr. is a 84 y.o. (06/07/1935) male who presents with chief complaint of  Chief Complaint  Patient presents with  . Follow-up    70yr AAA  .  History of Present Illness:   The patient returns to the office for surveillance of a known abdominal aortic aneurysm. Patient denies abdominal pain or back pain, no other abdominal complaints. No changes suggesting embolic episodes.   There have been no interval changes in the patient's overall health care since his last visit.  Patient denies amaurosis fugax or TIA symptoms. There is no history of claudication or rest pain symptoms of the lower extremities. The patient denies angina or shortness of breath.   Duplex US of the aorta and iliac arteries shows an AAA measured 3.1 cm.  Current Meds  Medication Sig  . aspirin EC 81 MG tablet Take 81 mg by mouth daily.  . hydrochlorothiazide (HYDRODIURIL) 25 MG tablet   . hydrocortisone (ANUSOL-HC) 2.5 % rectal cream as needed.   Marland Kitchen losartan (COZAAR) 100 MG tablet Take by mouth.  . metoprolol tartrate (LOPRESSOR) 25 MG tablet   . Omega-3 Fatty Acids (FISH OIL PO) Take by mouth.  Marland Kitchen omeprazole (PRILOSEC) 40 MG capsule   . vitamin B-12 (CYANOCOBALAMIN) 1000 MCG tablet Take by mouth.    Past Medical History:  Diagnosis Date  . Diabetes mellitus without complication (HCC)   . Hypertension   . Stroke [facility])     Past Surgical History:  Procedure Laterality Date  . CHOLECYSTECTOMY      Social History Social History   Tobacco Use  . Smoking status: Former Games developer  . Smokeless tobacco: Never Used  Substance Use Topics  . Alcohol use: Yes  . Drug use: Never    Family History Family History  Problem Relation Age of Onset  . Cancer Mother   . Heart attack Father   . Cancer Brother   . Heart attack Brother   . Heart Problems Son     Allergies  Allergen Reactions  . Clindamycin/Lincomycin Other (See Comments)    unknown  . Enalapril Maleate    Other reaction(s): Cough  . Erythromycin Other (See Comments)    unknown  . Nitrofurantoin     Other reaction(s): Unknown  . Sulfa Antibiotics Other (See Comments)    unknown  . Penicillins Rash  . Phenobarbital Rash  . Phenylbutazones Rash     REVIEW OF SYSTEMS (Negative unless checked)  Constitutional: [] Weight loss  [] Fever  [] Chills Cardiac: [] Chest pain   [] Chest pressure   [] Palpitations   [] Shortness of breath when laying flat   [] Shortness of breath with exertion. Vascular:  [] Pain in legs with walking   [] Pain in legs at rest  [] History of DVT   [] Phlebitis   [] Swelling in legs   [] Varicose veins   [] Non-healing ulcers Pulmonary:   [] Uses home oxygen   [] Productive cough   [] Hemoptysis   [] Wheeze  [] COPD   [] Asthma Neurologic:  [] Dizziness   [] Seizures   [] History of stroke   [] History of TIA  [] Aphasia   [] Vissual changes   [] Weakness or numbness in arm   [] Weakness or numbness in leg Musculoskeletal:   [] Joint swelling   [] Joint pain   [] Low back pain Hematologic:  [] Easy bruising  [] Easy bleeding   [] Hypercoagulable state   [] Anemic Gastrointestinal:  [] Diarrhea   [] Vomiting  [] Gastroesophageal reflux/heartburn   [] Difficulty swallowing. Genitourinary:  [] Chronic kidney disease   [] Difficult urination  []   Frequent urination   [] Blood in urine Skin:  [] Rashes   [] Ulcers  Psychological:  [] History of anxiety   []  History of major depression.  Physical Examination  Vitals:   12/21/18 0834  BP: (!) 177/70  Pulse: (!) 49  Resp: 16  Weight: 220 lb (99.8 kg)  Height: 5\' 8"  (1.727 m)   Body mass index is 33.45 kg/m. Gen: WD/WN, NAD Head: [place]/AT, No temporalis wasting.  Ear/Nose/Throat: Hearing grossly intact, nares w/o erythema or drainage Eyes: PER, EOMI, sclera nonicteric.  Neck: Supple, no large masses.   Pulmonary:  Good air movement, no audible wheezing bilaterally, no use of accessory muscles.  Cardiac: RRR, no JVD Vascular:  Vessel Right Left  Radial Palpable  Palpable  Popliteal Palpable Palpable  PT Palpable Palpable  DP Palpable Palpable  Gastrointestinal: Non-distended. No guarding/no peritoneal signs.  Musculoskeletal: M/S 5/5 throughout.  No deformity or atrophy.  Neurologic: CN 2-12 intact. Symmetrical.  Speech is fluent. Motor exam as listed above. Psychiatric: Judgment intact, Mood & affect appropriate for pt's clinical situation. Dermatologic: No rashes or ulcers noted.  No changes consistent with cellulitis. Lymph : No lichenification or skin changes of chronic lymphedema.  CBC Lab Results  Component Value Date   WBC 17.3 (H) 08/17/2015   HGB 13.5 08/17/2015   HCT 39.9 (L) 08/17/2015   MCV 87.9 08/17/2015   PLT 263 08/17/2015    BMET    Component Value Date/Time   NA 134 (L) 08/17/2015 1442   K 3.6 08/17/2015 1442   K 4.0 08/10/2012 1158   CL 95 (L) 08/17/2015 1442   CO2 30 08/17/2015 1442   GLUCOSE 135 (H) 08/17/2015 1442   BUN 15 08/17/2015 1442   CREATININE 0.77 08/17/2015 1442   CALCIUM 9.2 08/17/2015 1442   GFRNONAA >60 08/17/2015 1442   GFRAA >60 08/17/2015 1442   CrCl cannot be calculated (Patient's most recent lab result is older than the maximum 21 days allowed.).  COAG No results found for: INR, PROTIME  Radiology No results found.   Assessment/Plan 1. AAA (abdominal aortic aneurysm) without rupture (HCC) No surgery or intervention at this time. The patient has an asymptomatic abdominal aortic aneurysm that is less than 4 cm in maximal diameter.  I have discussed the natural history of abdominal aortic aneurysm and the small risk of rupture for aneurysm less than 5 cm in size.  However, as these small aneurysms tend to enlarge over time, continued surveillance with ultrasound or CT scan is mandatory.  I have also discussed optimizing medical management with hypertension and lipid control and the importance of abstinence from tobacco.  The patient is also encouraged to exercise a minimum of 30 minutes 4  times a week.  Should the patient develop new onset abdominal or back pain or signs of peripheral embolization they are instructed to seek medical attention immediately and to alert the physician providing care that they have an aneurysm.  The patient voices their understanding. The patient will return in 12 months with an aortic duplex.  - VAS US AORTA/IVC/ILIACS; Future  2. Essential hypertension Continue antihypertensive medications as already ordered, these medications have been reviewed and there are no changes at this time.   3. Gastroesophageal reflux disease without esophagitis Continue PPI as already ordered, this medication has been reviewed and there are no changes at this time.  Avoidence of caffeine and alcohol  Moderate elevation of the head of the bed     Levora Dredge, MD  12/21/2018 10:52  AM

## 2019-12-24 ENCOUNTER — Ambulatory Visit (INDEPENDENT_AMBULATORY_CARE_PROVIDER_SITE_OTHER): Payer: Medicare Other

## 2019-12-24 ENCOUNTER — Other Ambulatory Visit: Payer: Self-pay

## 2019-12-24 ENCOUNTER — Encounter (INDEPENDENT_AMBULATORY_CARE_PROVIDER_SITE_OTHER): Payer: Self-pay | Admitting: Vascular Surgery

## 2019-12-24 ENCOUNTER — Ambulatory Visit (INDEPENDENT_AMBULATORY_CARE_PROVIDER_SITE_OTHER): Payer: Medicare Other | Admitting: Vascular Surgery

## 2019-12-24 VITALS — BP 183/88 | HR 50 | Resp 12 | Ht 68.0 in | Wt 216.0 lb

## 2019-12-24 DIAGNOSIS — I714 Abdominal aortic aneurysm, without rupture, unspecified: Secondary | ICD-10-CM

## 2019-12-24 DIAGNOSIS — K219 Gastro-esophageal reflux disease without esophagitis: Secondary | ICD-10-CM

## 2019-12-24 DIAGNOSIS — I10 Essential (primary) hypertension: Secondary | ICD-10-CM | POA: Diagnosis not present

## 2019-12-25 ENCOUNTER — Encounter (INDEPENDENT_AMBULATORY_CARE_PROVIDER_SITE_OTHER): Payer: Self-pay | Admitting: Vascular Surgery

## 2019-12-25 NOTE — Progress Notes (Signed)
MRN : [id number]  Ivan MATSUOKA Sr. is a 85 y.o. (August 11, 1935) male who presents with chief complaint of  Chief Complaint  Patient presents with  . Follow-up    86yr Aorta lliac  .  History of Present Illness:  The patient returns to the office for surveillance of a known abdominal aortic aneurysm. Patient denies abdominal pain or back pain, no other abdominal complaints. No changes suggesting embolic episodes.   There have been no interval changes in the patient's overall health care since his last visit.  Patient denies amaurosis fugax or TIA symptoms. There is no history of claudication or rest pain symptoms of the lower extremities. The patient denies angina or shortness of breath.   Duplex US of the aorta and iliac arteries shows an AAA measured 2.9 cm. (Previous duplex US of the aorta and iliac arteries shows an AAA measured 3.1 cm)  Current Meds  Medication Sig  . aspirin EC 81 MG tablet Take 81 mg by mouth daily.  . hydrochlorothiazide (HYDRODIURIL) 25 MG tablet   . hydrocortisone (ANUSOL-HC) 2.5 % rectal cream as needed.   Marland Kitchen losartan (COZAAR) 100 MG tablet Take by mouth.  . meloxicam (MOBIC) 15 MG tablet meloxicam 15 mg tablet  . metoprolol tartrate (LOPRESSOR) 25 MG tablet   . Omega-3 Fatty Acids (FISH OIL PO) Take by mouth.  Marland Kitchen omeprazole (PRILOSEC) 40 MG capsule   . vitamin B-12 (CYANOCOBALAMIN) 1000 MCG tablet Take by mouth.    Past Medical History:  Diagnosis Date  . Diabetes mellitus without complication (HCC)   . Hypertension   . Stroke [facility])     Past Surgical History:  Procedure Laterality Date  . CHOLECYSTECTOMY      Social History Social History   Tobacco Use  . Smoking status: Former Games developer  . Smokeless tobacco: Never Used  Substance Use Topics  . Alcohol use: Yes  . Drug use: Never    Family History Family History  Problem Relation Age of Onset  . Cancer Mother   . Heart attack Father   . Cancer Brother   . Heart attack Brother     . Heart Problems Son     Allergies  Allergen Reactions  . Clindamycin/Lincomycin Other (See Comments)    unknown  . Enalapril Maleate     Other reaction(s): Cough  . Erythromycin Other (See Comments)    unknown  . Nitrofurantoin     Other reaction(s): Unknown  . Sulfa Antibiotics Other (See Comments)    unknown  . Penicillins Rash  . Phenobarbital Rash  . Phenylbutazones Rash     REVIEW OF SYSTEMS (Negative unless checked)  Constitutional: [] Weight loss  [] Fever  [] Chills Cardiac: [] Chest pain   [] Chest pressure   [] Palpitations   [] Shortness of breath when laying flat   [] Shortness of breath with exertion. Vascular:  [] Pain in legs with walking   [] Pain in legs at rest  [] History of DVT   [] Phlebitis   [] Swelling in legs   [] Varicose veins   [] Non-healing ulcers Pulmonary:   [] Uses home oxygen   [] Productive cough   [] Hemoptysis   [] Wheeze  [] COPD   [] Asthma Neurologic:  [] Dizziness   [] Seizures   [] History of stroke   [] History of TIA  [] Aphasia   [] Vissual changes   [] Weakness or numbness in arm   [] Weakness or numbness in leg Musculoskeletal:   [] Joint swelling   [] Joint pain   [] Low back pain Hematologic:  [] Easy bruising  [] Easy bleeding   []   Hypercoagulable state   [] Anemic Gastrointestinal:  [] Diarrhea   [] Vomiting  [x] Gastroesophageal reflux/heartburn   [] Difficulty swallowing. Genitourinary:  [] Chronic kidney disease   [] Difficult urination  [] Frequent urination   [] Blood in urine Skin:  [] Rashes   [] Ulcers  Psychological:  [] History of anxiety   []  History of major depression.  Physical Examination  Vitals:   12/24/19 0933  BP: (!) 183/88  Pulse: (!) 50  Resp: 12  Weight: 216 lb (98 kg)  Height: 5\' 8"  (1.727 m)   Body mass index is 32.84 kg/m. Gen: WD/WN, NAD Head: [place]/AT, No temporalis wasting.  Ear/Nose/Throat: Hearing grossly intact, nares w/o erythema or drainage Eyes: PER, EOMI, sclera nonicteric.  Neck: Supple, no large masses.   Pulmonary:  Good  air movement, no audible wheezing bilaterally, no use of accessory muscles.  Cardiac: RRR, no JVD Vascular:  No carotid bruits Vessel Right Left  Radial Palpable Palpable  Gastrointestinal: Non-distended. No guarding/no peritoneal signs.  Musculoskeletal: M/S 5/5 throughout.  No deformity or atrophy.  Neurologic: CN 2-12 intact. Symmetrical.  Speech is fluent. Motor exam as listed above. Psychiatric: Judgment intact, Mood & affect appropriate for pt's clinical situation. Dermatologic: No rashes or ulcers noted.  No changes consistent with cellulitis.  CBC Lab Results  Component Value Date   WBC 17.3 (H) 08/17/2015   HGB 13.5 08/17/2015   HCT 39.9 (L) 08/17/2015   MCV 87.9 08/17/2015   PLT 263 08/17/2015    BMET    Component Value Date/Time   NA 134 (L) 08/17/2015 1442   K 3.6 08/17/2015 1442   K 4.0 08/10/2012 1158   CL 95 (L) 08/17/2015 1442   CO2 30 08/17/2015 1442   GLUCOSE 135 (H) 08/17/2015 1442   BUN 15 08/17/2015 1442   CREATININE 0.77 08/17/2015 1442   CALCIUM 9.2 08/17/2015 1442   GFRNONAA >60 08/17/2015 1442   GFRAA >60 08/17/2015 1442   CrCl cannot be calculated (Patient's most recent lab result is older than the maximum 21 days allowed.).  COAG No results found for: INR, PROTIME  Radiology VAS [place] AAA DUPLEX  Result Date: 12/24/2019 ABDOMINAL AORTA STUDY Indications: Follow up exam for known AAA.  Comparison Study: 12/21/2018 Performing Technologist: Almira Coaster RVS  Examination Guidelines: A complete evaluation includes B-mode imaging, spectral Doppler, color Doppler, and power Doppler as needed of all accessible portions of each vessel. Bilateral testing is considered an integral part of a complete examination. Limited examinations for reoccurring indications may be performed as noted.  Abdominal Aorta Findings: +-------------+-------+----------+----------+----------+--------+--------+ Location     AP (cm)Trans (cm)PSV (cm/s)Waveform   ThrombusComments +-------------+-------+----------+----------+----------+--------+--------+ Proximal     2.80   2.94      45        monophasic                 +-------------+-------+----------+----------+----------+--------+--------+ Mid          2.41   2.63      85        monophasic                 +-------------+-------+----------+----------+----------+--------+--------+ Distal       2.45   2.73      81        monophasic                 +-------------+-------+----------+----------+----------+--------+--------+ RT CIA Prox  1.1    1.1       62        biphasic                   +-------------+-------+----------+----------+----------+--------+--------+  RT CIA Distal1.0    1.1       95        biphasic                   +-------------+-------+----------+----------+----------+--------+--------+ RT EIA Prox  1.2    1.0       106       biphasic                   +-------------+-------+----------+----------+----------+--------+--------+ RT EIA Distal1.2    1.2       124       biphasic                   +-------------+-------+----------+----------+----------+--------+--------+ LT CIA Prox  1.1    1.2       87        biphasic                   +-------------+-------+----------+----------+----------+--------+--------+ LT CIA Distal1.4    1.3       100       biphasic                   +-------------+-------+----------+----------+----------+--------+--------+ LT EIA Prox  1.2    1.3       109       biphasic                   +-------------+-------+----------+----------+----------+--------+--------+ LT EIA Distal1.3    1.4       102       biphasic                   +-------------+-------+----------+----------+----------+--------+--------+  Summary: Abdominal Aorta: There is evidence of abnormal dilatation of the proximal, mid and distal Abdominal aorta. The largest aortic diameter remains essentially unchanged compared to prior exam.  Previous diameter measurement was 3.1 cm obtained on 12/21/2018.  *See table(s) above for measurements and observations.  Electronically signed by Levora Dredge MD on 12/24/2019 at 1:49:01 PM.    Final      Assessment/Plan 1. AAA (abdominal aortic aneurysm) without rupture (HCC) No surgery or intervention at this time. The patient has an asymptomatic abdominal aortic aneurysm that is less than 4 cm in maximal diameter.  I have discussed the natural history of abdominal aortic aneurysm and the small risk of rupture for aneurysm less than 5 cm in size.  However, as these small aneurysms tend to enlarge over time, continued surveillance with ultrasound or CT scan is mandatory.  I have also discussed optimizing medical management with hypertension and lipid control and the importance of abstinence from tobacco.  The patient is also encouraged to exercise a minimum of 30 minutes 4 times a week.  Should the patient develop new onset abdominal or back pain or signs of peripheral embolization they are instructed to seek medical attention immediately and to alert the physician providing care that they have an aneurysm.  The patient voices their understanding. The patient will return in 24 months with an aortic duplex.  - VAS US AORTA/IVC/ILIACS; Future  2. Essential hypertension Continue antihypertensive medications as already ordered, these medications have been reviewed and there are no changes at this time.   3. Gastroesophageal reflux disease without esophagitis Continue PPI as already ordered, this medication has been reviewed and there are no changes at this time.  Avoidence of caffeine and alcohol  Moderate elevation of the head of the bed     [employer],  MD  12/25/2019 11:56 AM

## 2020-08-11 ENCOUNTER — Other Ambulatory Visit: Payer: Self-pay | Admitting: Physician Assistant

## 2020-08-11 DIAGNOSIS — M2391 Unspecified internal derangement of right knee: Secondary | ICD-10-CM

## 2020-08-27 ENCOUNTER — Other Ambulatory Visit: Payer: Self-pay

## 2020-08-27 ENCOUNTER — Ambulatory Visit
Admission: RE | Admit: 2020-08-27 | Discharge: 2020-08-27 | Disposition: A | Discharge status: Home / Self Care | Payer: Medicare Other | Source: Ambulatory Visit | Attending: Physician Assistant | Admitting: Physician Assistant

## 2020-08-27 DIAGNOSIS — M2391 Unspecified internal derangement of right knee: Secondary | ICD-10-CM | POA: Diagnosis not present

## 2021-12-28 ENCOUNTER — Ambulatory Visit (INDEPENDENT_AMBULATORY_CARE_PROVIDER_SITE_OTHER): Payer: Medicare Other | Admitting: Vascular Surgery

## 2021-12-28 ENCOUNTER — Other Ambulatory Visit (INDEPENDENT_AMBULATORY_CARE_PROVIDER_SITE_OTHER): Payer: Self-pay | Admitting: Vascular Surgery

## 2021-12-28 ENCOUNTER — Other Ambulatory Visit: Payer: Self-pay

## 2021-12-28 ENCOUNTER — Encounter (INDEPENDENT_AMBULATORY_CARE_PROVIDER_SITE_OTHER): Payer: Self-pay | Admitting: Vascular Surgery

## 2021-12-28 ENCOUNTER — Ambulatory Visit (INDEPENDENT_AMBULATORY_CARE_PROVIDER_SITE_OTHER): Payer: Medicare Other

## 2021-12-28 VITALS — BP 176/73 | HR 60 | Resp 16 | Wt 209.4 lb

## 2021-12-28 DIAGNOSIS — I714 Abdominal aortic aneurysm, without rupture, unspecified: Secondary | ICD-10-CM | POA: Diagnosis not present

## 2021-12-28 DIAGNOSIS — I10 Essential (primary) hypertension: Secondary | ICD-10-CM | POA: Diagnosis not present

## 2021-12-28 DIAGNOSIS — I7143 Infrarenal abdominal aortic aneurysm, without rupture: Secondary | ICD-10-CM

## 2021-12-28 DIAGNOSIS — K219 Gastro-esophageal reflux disease without esophagitis: Secondary | ICD-10-CM

## 2021-12-28 NOTE — Progress Notes (Signed)
MRN : [id number]  Ivan MIKULAK Sr. is a 87 y.o. ([birth year]) male who presents with chief complaint of check my aneurysm.  History of Present Illness: The patient returns to the office for surveillance of a known abdominal aortic aneurysm. Patient denies abdominal pain or back pain, no other abdominal complaints. No changes suggesting embolic episodes.    There have been no interval changes in the patient's overall health care since his last visit.   Patient denies amaurosis fugax or TIA symptoms. There is no history of claudication or rest pain symptoms of the lower extremities. The patient denies angina or shortness of breath.    Duplex US of the aorta and iliac arteries shows an AAA measured 2.9 cm. (Previous duplex US of the aorta and iliac arteries shows an AAA measured 2.7 cm)  Current Meds  Medication Sig   aspirin EC 81 MG tablet Take 81 mg by mouth daily.   furosemide (LASIX) 40 MG tablet Take 80 mg by mouth 2 (two) times daily.   hydrochlorothiazide (HYDRODIURIL) 25 MG tablet    hydrocortisone (ANUSOL-HC) 2.5 % rectal cream as needed.    losartan (COZAAR) 100 MG tablet Take by mouth.   meloxicam (MOBIC) 15 MG tablet meloxicam 15 mg tablet   metoprolol tartrate (LOPRESSOR) 25 MG tablet    Omega-3 Fatty Acids (FISH OIL PO) Take by mouth.   omeprazole (PRILOSEC) 40 MG capsule    vitamin B-12 (CYANOCOBALAMIN) 1000 MCG tablet Take by mouth.    Past Medical History:  Diagnosis Date   Diabetes mellitus without complication ([place])    Hypertension    Stroke [facility])     Past Surgical History:  Procedure Laterality Date   CHOLECYSTECTOMY      Social History Social History   Tobacco Use   Smoking status: Former   Smokeless tobacco: Never  Substance Use Topics   Alcohol use: Yes   Drug use: Never    Family History Family History  Problem Relation Age of Onset   Cancer Mother    Heart attack Father    Cancer Brother    Heart attack Brother    Heart Problems Son      Allergies  Allergen Reactions   Clindamycin/Lincomycin Other (See Comments)    unknown   Enalapril Maleate     Other reaction(s): Cough   Erythromycin Other (See Comments)    unknown   Nitrofurantoin     Other reaction(s): Unknown   Sulfa Antibiotics Other (See Comments)    unknown   Penicillins Rash   Phenobarbital Rash   Phenylbutazones Rash     REVIEW OF SYSTEMS (Negative unless checked)  Constitutional: [] Weight loss  [] Fever  [] Chills Cardiac: [] Chest pain   [] Chest pressure   [] Palpitations   [] Shortness of breath when laying flat   [] Shortness of breath with exertion. Vascular:  [] Pain in legs with walking   [] Pain in legs at rest  [] History of DVT   [] Phlebitis   [] Swelling in legs   [] Varicose veins   [] Non-healing ulcers Pulmonary:   [] Uses home oxygen   [] Productive cough   [] Hemoptysis   [] Wheeze  [] COPD   [] Asthma Neurologic:  [] Dizziness   [] Seizures   [] History of stroke   [] History of TIA  [] Aphasia   [] Vissual changes   [] Weakness or numbness in arm   [] Weakness or numbness in leg Musculoskeletal:   [] Joint swelling   [] Joint pain   [] Low back pain Hematologic:  [] Easy bruising  [] Easy bleeding   []   Hypercoagulable state   [] Anemic Gastrointestinal:  [] Diarrhea   [] Vomiting  [x] Gastroesophageal reflux/heartburn   [] Difficulty swallowing. Genitourinary:  [] Chronic kidney disease   [] Difficult urination  [] Frequent urination   [] Blood in urine Skin:  [] Rashes   [] Ulcers  Psychological:  [] History of anxiety   []  History of major depression.  Physical Examination  Vitals:   12/28/21 0915  BP: (!) 176/73  Pulse: 60  Resp: 16  Weight: 209 lb 6.4 oz (95 kg)   Body mass index is 31.84 kg/m. Gen: WD/WN, NAD Head: [place]/AT, No temporalis wasting.  Ear/Nose/Throat: Hearing grossly intact, nares w/o erythema or drainage Eyes: PER, EOMI, sclera nonicteric.  Neck: Supple, no masses.  No bruit or JVD.  Pulmonary:  Good air movement, no audible wheezing, no use of  accessory muscles.  Cardiac: RRR, normal S1, S2, no Murmurs. Vascular:   Vessel Right Left  Radial Palpable Palpable  Gastrointestinal: soft, non-distended. No guarding/no peritoneal signs.  Musculoskeletal: M/S 5/5 throughout.  No visible deformity.  Neurologic: CN 2-12 intact. Pain and light touch intact in extremities.  Symmetrical.  Speech is fluent. Motor exam as listed above. Psychiatric: Judgment intact, Mood & affect appropriate for pt's clinical situation. Dermatologic: No rashes or ulcers noted.  No changes consistent with cellulitis.   CBC Lab Results  Component Value Date   WBC 17.3 (H) 08/17/2015   HGB 13.5 08/17/2015   HCT 39.9 (L) 08/17/2015   MCV 87.9 08/17/2015   PLT 263 08/17/2015    BMET    Component Value Date/Time   NA 134 (L) 08/17/2015 1442   K 3.6 08/17/2015 1442   K 4.0 08/10/2012 1158   CL 95 (L) 08/17/2015 1442   CO2 30 08/17/2015 1442   GLUCOSE 135 (H) 08/17/2015 1442   BUN 15 08/17/2015 1442   CREATININE 0.77 08/17/2015 1442   CALCIUM 9.2 08/17/2015 1442   GFRNONAA >60 08/17/2015 1442   GFRAA >60 08/17/2015 1442   CrCl cannot be calculated (Patient's most recent lab result is older than the maximum 21 days allowed.).  COAG No results found for: INR, PROTIME  Radiology No results found.   Assessment/Plan 1. Infrarenal abdominal aortic aneurysm (AAA) without rupture No surgery or intervention at this time. The patient has an asymptomatic abdominal aortic aneurysm that is less than 4 cm in maximal diameter.  I have discussed the natural history of abdominal aortic aneurysm and the small risk of rupture for aneurysm less than 5 cm in size.  However, as these small aneurysms tend to enlarge over time, continued surveillance with ultrasound or CT scan is mandatory.  I have also discussed optimizing medical management with hypertension and lipid control and the importance of abstinence from tobacco.  The patient is also encouraged to exercise a  minimum of 30 minutes 4 times a week.  Should the patient develop new onset abdominal or back pain or signs of peripheral embolization they are instructed to seek medical attention immediately and to alert the physician providing care that they have an aneurysm.  The patient voices their understanding. The patient will return in 24 months with an aortic duplex.  - VAS US AORTA/IVC/ILIACS; Future  2. Essential hypertension Continue antihypertensive medications as already ordered, these medications have been reviewed and there are no changes at this time.   3. Gastroesophageal reflux disease without esophagitis Continue PPI as already ordered, this medication has been reviewed and there are no changes at this time.  Avoidence of caffeine and alcohol  Moderate elevation  of the head of the bed      Hortencia Pilar, MD  12/28/2021 9:28 AM

## 2022-01-12 ENCOUNTER — Encounter: Payer: Self-pay | Admitting: Emergency Medicine

## 2022-01-12 ENCOUNTER — Inpatient Hospital Stay
Admission: EM | Admit: 2022-01-12 | Discharge: 2022-01-14 | DRG: 308 | Disposition: A | Discharge status: Home / Self Care | Payer: Medicare Other | Source: Ambulatory Visit | Attending: Family Medicine | Admitting: Family Medicine

## 2022-01-12 ENCOUNTER — Emergency Department: Payer: Medicare Other

## 2022-01-12 ENCOUNTER — Other Ambulatory Visit: Payer: Self-pay

## 2022-01-12 DIAGNOSIS — K219 Gastro-esophageal reflux disease without esophagitis: Secondary | ICD-10-CM | POA: Diagnosis present

## 2022-01-12 DIAGNOSIS — Z20822 Contact with and (suspected) exposure to covid-19: Secondary | ICD-10-CM | POA: Diagnosis present

## 2022-01-12 DIAGNOSIS — R7989 Other specified abnormal findings of blood chemistry: Secondary | ICD-10-CM | POA: Diagnosis present

## 2022-01-12 DIAGNOSIS — Z8249 Family history of ischemic heart disease and other diseases of the circulatory system: Secondary | ICD-10-CM

## 2022-01-12 DIAGNOSIS — I248 Other forms of acute ischemic heart disease: Secondary | ICD-10-CM | POA: Diagnosis present

## 2022-01-12 DIAGNOSIS — I714 Abdominal aortic aneurysm, without rupture, unspecified: Secondary | ICD-10-CM | POA: Diagnosis present

## 2022-01-12 DIAGNOSIS — I110 Hypertensive heart disease with heart failure: Secondary | ICD-10-CM | POA: Diagnosis present

## 2022-01-12 DIAGNOSIS — I639 Cerebral infarction, unspecified: Secondary | ICD-10-CM | POA: Diagnosis present

## 2022-01-12 DIAGNOSIS — I5031 Acute diastolic (congestive) heart failure: Secondary | ICD-10-CM | POA: Diagnosis present

## 2022-01-12 DIAGNOSIS — I482 Chronic atrial fibrillation, unspecified: Secondary | ICD-10-CM | POA: Diagnosis present

## 2022-01-12 DIAGNOSIS — Z8673 Personal history of transient ischemic attack (TIA), and cerebral infarction without residual deficits: Secondary | ICD-10-CM | POA: Diagnosis not present

## 2022-01-12 DIAGNOSIS — D72829 Elevated white blood cell count, unspecified: Secondary | ICD-10-CM | POA: Diagnosis not present

## 2022-01-12 DIAGNOSIS — I509 Heart failure, unspecified: Secondary | ICD-10-CM

## 2022-01-12 DIAGNOSIS — E119 Type 2 diabetes mellitus without complications: Secondary | ICD-10-CM | POA: Diagnosis present

## 2022-01-12 DIAGNOSIS — Z87891 Personal history of nicotine dependence: Secondary | ICD-10-CM | POA: Diagnosis not present

## 2022-01-12 DIAGNOSIS — Z7982 Long term (current) use of aspirin: Secondary | ICD-10-CM | POA: Diagnosis not present

## 2022-01-12 DIAGNOSIS — J9601 Acute respiratory failure with hypoxia: Secondary | ICD-10-CM | POA: Diagnosis present

## 2022-01-12 DIAGNOSIS — I4891 Unspecified atrial fibrillation: Principal | ICD-10-CM

## 2022-01-12 DIAGNOSIS — R001 Bradycardia, unspecified: Secondary | ICD-10-CM

## 2022-01-12 DIAGNOSIS — Z79899 Other long term (current) drug therapy: Secondary | ICD-10-CM | POA: Diagnosis not present

## 2022-01-12 DIAGNOSIS — Z791 Long term (current) use of non-steroidal anti-inflammatories (NSAID): Secondary | ICD-10-CM | POA: Diagnosis not present

## 2022-01-12 DIAGNOSIS — R0902 Hypoxemia: Secondary | ICD-10-CM

## 2022-01-12 DIAGNOSIS — R778 Other specified abnormalities of plasma proteins: Secondary | ICD-10-CM | POA: Diagnosis not present

## 2022-01-12 DIAGNOSIS — I10 Essential (primary) hypertension: Secondary | ICD-10-CM | POA: Diagnosis not present

## 2022-01-12 DIAGNOSIS — E876 Hypokalemia: Secondary | ICD-10-CM

## 2022-01-12 DIAGNOSIS — R0602 Shortness of breath: Secondary | ICD-10-CM

## 2022-01-12 LAB — CBC
HCT: 39.1 % (ref 39.0–52.0)
Hemoglobin: 12.6 g/dL — ABNORMAL LOW (ref 13.0–17.0)
MCH: 29.4 pg (ref 26.0–34.0)
MCHC: 32.2 g/dL (ref 30.0–36.0)
MCV: 91.4 fL (ref 80.0–100.0)
Platelets: 252 10*3/uL (ref 150–400)
RBC: 4.28 MIL/uL (ref 4.22–5.81)
RDW: 13.3 % (ref 11.5–15.5)
WBC: 11.7 10*3/uL — ABNORMAL HIGH (ref 4.0–10.5)
nRBC: 0 % (ref 0.0–0.2)

## 2022-01-12 LAB — BASIC METABOLIC PANEL
Anion gap: 8 (ref 5–15)
BUN: 17 mg/dL (ref 8–23)
CO2: 32 mmol/L (ref 22–32)
Calcium: 8.7 mg/dL — ABNORMAL LOW (ref 8.9–10.3)
Chloride: 96 mmol/L — ABNORMAL LOW (ref 98–111)
Creatinine, Ser: 1.06 mg/dL (ref 0.61–1.24)
GFR, Estimated: >60 mL/min (ref >60)
Glucose, Bld: 167 mg/dL — ABNORMAL HIGH (ref 70–99)
Potassium: 3.6 mmol/L (ref 3.5–5.1)
Sodium: 136 mmol/L (ref 135–145)

## 2022-01-12 LAB — HEPATIC FUNCTION PANEL
ALT: 22 U/L (ref 0–44)
AST: 24 U/L (ref 15–41)
Albumin: 3.7 g/dL (ref 3.5–5.0)
Alkaline Phosphatase: 43 U/L (ref 38–126)
Bilirubin, Direct: 0.3 mg/dL — ABNORMAL HIGH (ref 0.0–0.2)
Indirect Bilirubin: 0.9 mg/dL (ref 0.3–0.9)
Total Bilirubin: 1.2 mg/dL (ref 0.3–1.2)
Total Protein: 7.5 g/dL (ref 6.5–8.1)

## 2022-01-12 LAB — RESP PANEL BY RT-PCR (FLU A&B, COVID) ARPGX2
Influenza A by PCR: NEGATIVE
Influenza B by PCR: NEGATIVE
SARS Coronavirus 2 by RT PCR: NEGATIVE

## 2022-01-12 LAB — TROPONIN I (HIGH SENSITIVITY)
Troponin I (High Sensitivity): 49 ng/L — ABNORMAL HIGH (ref <18)
Troponin I (High Sensitivity): 48 ng/L — ABNORMAL HIGH (ref <18)

## 2022-01-12 LAB — TSH: TSH: 2.104 u[IU]/mL (ref 0.350–4.500)

## 2022-01-12 LAB — BRAIN NATRIURETIC PEPTIDE: B Natriuretic Peptide: 351.5 pg/mL — ABNORMAL HIGH (ref 0.0–100.0)

## 2022-01-12 LAB — MAGNESIUM: Magnesium: 2.3 mg/dL (ref 1.7–2.4)

## 2022-01-12 LAB — APTT: aPTT: 35 seconds (ref 24–36)

## 2022-01-12 LAB — PROCALCITONIN: Procalcitonin: <0.1 ng/mL

## 2022-01-12 LAB — HEPARIN LEVEL (UNFRACTIONATED): Heparin Unfractionated: 0.47 IU/mL (ref 0.30–0.70)

## 2022-01-12 MED ORDER — HEPARIN (PORCINE) 25000 UT/250ML-% IV SOLN
1600.0000 [IU]/h | INTRAVENOUS | Status: DC
  Administered 2022-01-12 – 2022-01-13 (×3): 1400 [IU]/h via INTRAVENOUS
  Filled 2022-01-12 (×3): qty 250

## 2022-01-12 MED ORDER — ASPIRIN EC 81 MG PO TBEC
81.0000 mg | DELAYED_RELEASE_TABLET | Freq: Every day | ORAL | Status: DC
  Administered 2022-01-12 – 2022-01-14 (×3): 81 mg via ORAL
  Filled 2022-01-12 (×3): qty 1

## 2022-01-12 MED ORDER — ALBUTEROL SULFATE (2.5 MG/3ML) 0.083% IN NEBU: 3.0000 mL | INHALATION_SOLUTION | RESPIRATORY_TRACT | Status: DC | PRN

## 2022-01-12 MED ORDER — VITAMIN B-12 1000 MCG PO TABS
1000.0000 ug | ORAL_TABLET | Freq: Every day | ORAL | Status: DC
  Administered 2022-01-12 – 2022-01-14 (×3): 1000 ug via ORAL
  Filled 2022-01-12 (×3): qty 1

## 2022-01-12 MED ORDER — PANTOPRAZOLE SODIUM 40 MG PO TBEC
40.0000 mg | DELAYED_RELEASE_TABLET | Freq: Every day | ORAL | Status: DC
Start: 2022-01-12 — End: 2022-01-14
  Administered 2022-01-13 – 2022-01-14 (×2): 40 mg via ORAL
  Filled 2022-01-12 (×3): qty 1

## 2022-01-12 MED ORDER — FUROSEMIDE 10 MG/ML IJ SOLN
60.0000 mg | Freq: Once | INTRAMUSCULAR | Status: AC
  Administered 2022-01-12: 60 mg via INTRAVENOUS
  Filled 2022-01-12: qty 6

## 2022-01-12 MED ORDER — FUROSEMIDE 10 MG/ML IJ SOLN
40.0000 mg | Freq: Two times a day (BID) | INTRAMUSCULAR | Status: DC
Start: 2022-01-13 — End: 2022-01-14
  Administered 2022-01-13 – 2022-01-14 (×3): 40 mg via INTRAVENOUS
  Filled 2022-01-12 (×3): qty 4

## 2022-01-12 MED ORDER — HYDRALAZINE HCL 20 MG/ML IJ SOLN
5.0000 mg | INTRAMUSCULAR | Status: DC | PRN
  Administered 2022-01-12: 5 mg via INTRAVENOUS
  Filled 2022-01-12: qty 1

## 2022-01-12 MED ORDER — ONDANSETRON HCL 4 MG/2ML IJ SOLN: 4.0000 mg | Freq: Three times a day (TID) | INTRAMUSCULAR | Status: DC | PRN

## 2022-01-12 MED ORDER — HEPARIN BOLUS VIA INFUSION
5000.0000 [IU] | Freq: Once | INTRAVENOUS | Status: AC
  Administered 2022-01-12: 5000 [IU] via INTRAVENOUS
  Filled 2022-01-12: qty 5000

## 2022-01-12 MED ORDER — POLYETHYLENE GLYCOL 3350 17 G PO PACK
17.0000 g | PACK | Freq: Every day | ORAL | Status: DC
  Administered 2022-01-12 – 2022-01-14 (×2): 17 g via ORAL
  Filled 2022-01-12 (×3): qty 1

## 2022-01-12 MED ORDER — DM-GUAIFENESIN ER 30-600 MG PO TB12
1.0000 | ORAL_TABLET | Freq: Two times a day (BID) | ORAL | Status: DC | PRN
Start: 2022-01-12 — End: 2022-01-14

## 2022-01-12 MED ORDER — ACETAMINOPHEN 325 MG PO TABS
650.0000 mg | ORAL_TABLET | Freq: Four times a day (QID) | ORAL | Status: DC | PRN
Start: 2022-01-12 — End: 2022-01-14

## 2022-01-12 MED ORDER — LOSARTAN POTASSIUM 50 MG PO TABS
100.0000 mg | ORAL_TABLET | Freq: Every day | ORAL | Status: DC
  Administered 2022-01-13: 100 mg via ORAL
  Filled 2022-01-12 (×2): qty 2

## 2022-01-12 MED ORDER — OMEGA-3-ACID ETHYL ESTERS 1 G PO CAPS
1.0000 g | ORAL_CAPSULE | Freq: Every day | ORAL | Status: DC
  Administered 2022-01-12 – 2022-01-14 (×3): 1 g via ORAL
  Filled 2022-01-12 (×3): qty 1

## 2022-01-12 MED ORDER — GLUCAGON HCL RDNA (DIAGNOSTIC) 1 MG IJ SOLR
1.0000 mg | Freq: Once | INTRAMUSCULAR | Status: DC | PRN
Start: 2022-01-12 — End: 2022-01-14

## 2022-01-12 NOTE — Consult Note (Signed)
ANTICOAGULATION CONSULT NOTE - Initial Consult ? ?Pharmacy Consult for heparin ?Indication: atrial fibrillation ? ?Allergies  ?Allergen Reactions  ? Clindamycin/Lincomycin Other (See Comments)  ?  unknown  ? Enalapril Maleate   ?  Other reaction(s): Cough  ? Erythromycin Other (See Comments)  ?  unknown  ? Nitrofurantoin   ?  Other reaction(s): Unknown  ? Sulfa Antibiotics Other (See Comments)  ?  unknown  ? Penicillins Rash  ? Phenobarbital Rash  ? Phenylbutazones Rash  ? ? ?Patient Measurements: ?Height: 5\' 8"  (172.7 cm) ?Weight: 100 kg (220 lb 7.4 oz) ?IBW/kg (Calculated) : 68.4 ?Heparin Dosing Weight: 100kg ? ?Vital Signs: ?Temp: 98.2 ?F (36.8 ?C) (03/14 2030) ?Temp Source: Oral (03/14 2030) ?BP: 154/64 (03/14 2030) ?Pulse Rate: 57 (03/14 2030) ? ?Labs: ?Recent Labs  ?  01/12/22 ?1129 01/12/22 ?1634 01/12/22 ?2252  ?HGB 12.6*  --   --   ?HCT 39.1  --   --   ?PLT 252  --   --   ?APTT  --  35  --   ?HEPARINUNFRC  --   --  0.47  ?CREATININE 1.06  --   --   ?TROPONINIHS 49* 48*  --   ? ? ? ?Estimated Creatinine Clearance: 59.4 mL/min (by C-G formula based on SCr of 1.06 mg/dL). ? ? ?Medical History: ?Past Medical History:  ?Diagnosis Date  ? Diabetes mellitus without complication (HCC)   ? Hypertension   ? Stroke [facility])   ? ? ?Medications:  ?PTA: Clopidogrel  75mg  PO daily ?Inpatient: Heparin drip (3/14 >>>) ?Allergies: No AC/APT related allergies ? ?Date Time aPTT/HL Rate/Comment ? ? ?Baseline Labs: ?aPTT - pending ?Hgb - 12.6 ?Plts - 252 ? ? ?Assessment: ?87 y.o. male with medical history significant of HTN, stroke, GERD, AAA, sinus bradycardia, who presents with shortness of breath. Pharmacy consulted for management of heparin drip in the setting of new onset atrial fibrillation ? ?Goal of Therapy:  ?Heparin level 0.3-0.7 units/ml ?Monitor platelets by anticoagulation protocol: Yes ?  ?Plan:  ?3/14:  HL @ 2252 = 0.47, therapeutic X 1  ?Will continue pt on current rate and draw confirmation level on 3/15 @ 0700.   ? ?Kathyjo Briere D ?Clinical Pharmacist ?01/12/2022 ?11:38 PM ? ? ? ?

## 2022-01-12 NOTE — Consult Note (Signed)
ANTICOAGULATION CONSULT NOTE - Initial Consult ? ?Pharmacy Consult for heparin ?Indication: atrial fibrillation ? ?Allergies  ?Allergen Reactions  ? Clindamycin/Lincomycin Other (See Comments)  ?  unknown  ? Enalapril Maleate   ?  Other reaction(s): Cough  ? Erythromycin Other (See Comments)  ?  unknown  ? Nitrofurantoin   ?  Other reaction(s): Unknown  ? Sulfa Antibiotics Other (See Comments)  ?  unknown  ? Penicillins Rash  ? Phenobarbital Rash  ? Phenylbutazones Rash  ? ? ?Patient Measurements: ?Height: 5\' 8"  (172.7 cm) ?IBW/kg (Calculated) : 68.4 ?Heparin Dosing Weight: 100kg ? ?Vital Signs: ?Temp: 98.5 ?F (36.9 ?C) (03/14 1143) ?Temp Source: Oral (03/14 1143) ?BP: 157/64 (03/14 1330) ?Pulse Rate: 32 (03/14 1330) ? ?Labs: ?Recent Labs  ?  01/12/22 ?1129  ?HGB 12.6*  ?HCT 39.1  ?PLT 252  ?CREATININE 1.06  ?TROPONINIHS 49*  ? ? ?CrCl cannot be calculated (Unknown ideal weight.). ? ? ?Medical History: ?Past Medical History:  ?Diagnosis Date  ? Diabetes mellitus without complication ([place])   ? Hypertension   ? Stroke [facility])   ? ? ?Medications:  ?PTA: Clopidogrel  75mg  PO daily ?Inpatient: Heparin drip (3/14 >>>) ?Allergies: No AC/APT related allergies ? ?Date Time aPTT/HL Rate/Comment ? ? ?Baseline Labs: ?aPTT - pending ?Hgb - 12.6 ?Plts - 252 ? ? ?Assessment: ?87 y.o. male with medical history significant of HTN, stroke, GERD, AAA, sinus bradycardia, who presents with shortness of breath. Pharmacy consulted for management of heparin drip in the setting of new onset atrial fibrillation ? ?Goal of Therapy:  ?Heparin level 0.3-0.7 units/ml ?Monitor platelets by anticoagulation protocol: Yes ?  ?Plan:  ?Give 5000 units bolus x1; then start heparin infusion at 1400 units/hr ?Check anti-Xa level in 8 hours and daily once consecutively therapeutic. ?Continue to monitor H&H and platelets daily while on heparin gtt. ? ?Darrick Penna, PharmD, MS PGPM ?Clinical Pharmacist ?01/12/2022 ?2:38 PM ? ? ? ?

## 2022-01-12 NOTE — ED Triage Notes (Signed)
Pt to ED via POV with c/o Shortness of breath for the last three days he went to his PMD and they found him to have a new onset of Afib. His heart rate is 38 on EKG ?

## 2022-01-12 NOTE — H&P (Signed)
?History and Physical  ? ? ?Ivan PEREDA Sr. [id number] DOB: 1935-04-08 DOA: 01/12/2022 ? ?Referring MD/NP/PA:  ? ?PCP: Baxter Hire, MD  ? ?Patient coming from:  The patient is coming from home.  ? ?Chief Complaint: SOB ? ?HPI: Ivan BEVANS Sr. is a 87 y.o. male with medical history significant of HTN, stroke, GERD, AAA, sinus bradycardia, who presents with shortness of breath. ? ?Patient states that he has shortness of breath for about 1 week, which has been progressively worsening, particularly on exertion.  Patient has dry cough, no chest pain, fever or chills.  Patient also has bilateral lower leg edema.  He states that he has gained 5 pounds in the past 3 weeks.  Patient denies history of congestive heart failure. Patient states that he is constipated recently, no nausea, vomiting, abdominal pain.  No symptoms of UTI.  Patient was seen by PCP this morning and was found to have new onset atrial fibrillation, with heart rate 38.  Patient does not have dark stool or rectal bleeding.  No recent fall or head injury. ? ?Data Reviewed and ED Course: pt was found to have BNP 351, troponin level 49, WBC 11.7, negative COVID PCR, creatinine 1.06, BUN 17, temperature normal, blood pressure 192/70, 64/63, heart rate in 30s currently, RR 32, oxygen saturation 88% on room air, which improved to 95% on 2 L oxygen.  Chest x-ray showed bilateral opacity and cardiomegaly.  Patient is admitted to progressive bed as inpatient.  Dr. Nehemiah Massed of cardiology is consulted. ? ?EKG: I have personally reviewed.  Atrial fibrillation, heart rate of 43, poor R wave progression, PVC, borderline left axis deviation ? ? ?Review of Systems:  ? ?General: no fevers, chills, has body weight gain, has fatigue ?HEENT: no blurry vision, hearing changes or sore throat ?Respiratory: has dyspnea, coughing, no wheezing ?CV: no chest pain, no palpitations ?GI: no nausea, vomiting, abdominal pain, diarrhea, has constipation ?GU: no dysuria,  burning on urination, increased urinary frequency, hematuria  ?Ext: has leg edema ?Neuro: no unilateral weakness, numbness, or tingling, no vision change or hearing loss ?Skin: no rash, no skin tear. ?MSK: No muscle spasm, no deformity, no limitation of range of movement in spin ?Heme: No easy bruising.  ?Travel history: No recent long distant travel. ? ? ?Allergy:  ?Allergies  ?Allergen Reactions  ? Clindamycin/Lincomycin Other (See Comments)  ?  unknown  ? Enalapril Maleate   ?  Other reaction(s): Cough  ? Erythromycin Other (See Comments)  ?  unknown  ? Nitrofurantoin   ?  Other reaction(s): Unknown  ? Sulfa Antibiotics Other (See Comments)  ?  unknown  ? Penicillins Rash  ? Phenobarbital Rash  ? Phenylbutazones Rash  ? ? ?Past Medical History:  ?Diagnosis Date  ? Diabetes mellitus without complication ([place])   ? Hypertension   ? Stroke [facility])   ? ? ?Past Surgical History:  ?Procedure Laterality Date  ? CHOLECYSTECTOMY    ? ? ?Social History:  reports that he has quit smoking. He has never used smokeless tobacco. He reports current alcohol use. He reports that he does not use drugs. ? ?Family History:  ?Family History  ?Problem Relation Age of Onset  ? Cancer Mother   ? Heart attack Father   ? Cancer Brother   ? Heart attack Brother   ? Heart Problems Son   ?  ? ?Prior to Admission medications   ?Medication Sig Start Date End Date Taking? Authorizing Provider  ?aspirin EC 81  MG tablet Take 81 mg by mouth daily.    [provider]  ?furosemide (LASIX) 40 MG tablet Take 80 mg by mouth 2 (two) times daily. 11/22/21   [provider]  ?hydrochlorothiazide (HYDRODIURIL) 25 MG tablet  12/13/16   [provider]  ?hydrocortisone (ANUSOL-HC) 2.5 % rectal cream as needed.  09/20/17   [provider]  ?losartan (COZAAR) 100 MG tablet Take by mouth. 08/24/16   [provider]  ?meloxicam (MOBIC) 15 MG tablet meloxicam 15 mg tablet 01/01/19   [provider]  ?metoprolol  tartrate (LOPRESSOR) 25 MG tablet  10/04/16   [provider]  ?naproxen sodium (ALEVE) 220 MG tablet Take by mouth. ?Patient not taking: Reported on 12/28/2021    [provider]  ?Omega-3 Fatty Acids (FISH OIL PO) Take by mouth.    [provider]  ?omeprazole (PRILOSEC) 40 MG capsule  10/18/16   [provider]  ?vitamin B-12 (CYANOCOBALAMIN) 1000 MCG tablet Take by mouth.    [provider]  ? ? ?Physical Exam: ?Vitals:  ? 01/12/22 1315 01/12/22 1330 01/12/22 1400 01/12/22 1445  ?BP:  (!) 157/64    ?Pulse: (!) 32 (!) 32    ?Resp: (!) 22 (!) 27  20  ?Temp:      ?TempSrc:      ?SpO2: 97% 97%    ?Weight:   100 kg   ?Height:      ? ?General: Not in acute distress ?HEENT: ?      Eyes: PERRL, EOMI, no scleral icterus. ?      ENT: No discharge from the ears and nose, no pharynx injection, no tonsillar enlargement.  ?      Neck: positive JVD, no bruit, no mass felt. ?Heme: No neck lymph node enlargement. ?Cardiac: S1/S2, RRR, No murmurs, No gallops or rubs. ?Respiratory: Fine crackles bilaterally ?GI: Soft, nondistended, nontender, no rebound pain, no organomegaly, BS present. ?GU: No hematuria ?Ext: 2+ pitting leg edema bilaterally. 1+DP/PT pulse bilaterally. ?Musculoskeletal: No joint deformities, No joint redness or warmth, no limitation of ROM in spin. ?Skin: No rashes.  ?Neuro: Alert, oriented X3, cranial nerves II-XII grossly intact, moves all extremities normally. ?Psych: Patient is not psychotic, no suicidal or hemocidal ideation. ? ?Labs on Admission: I have personally reviewed following labs and imaging studies ? ?CBC: ?Recent Labs  ?Lab 01/12/22 ?1129  ?WBC 11.7*  ?HGB 12.6*  ?HCT 39.1  ?MCV 91.4  ?PLT 252  ? ?Basic Metabolic Panel: ?Recent Labs  ?Lab 01/12/22 ?1129  ?NA 136  ?K 3.6  ?CL 96*  ?CO2 32  ?GLUCOSE 167*  ?BUN 17  ?CREATININE 1.06  ?CALCIUM 8.7*  ?MG 2.3  ? ?GFR: ?Estimated Creatinine Clearance: 59.4 mL/min (by C-G formula based on SCr of 1.06  mg/dL). ?Liver Function Tests: ?Recent Labs  ?Lab 01/12/22 ?1129  ?AST 24  ?ALT 22  ?ALKPHOS 43  ?BILITOT 1.2  ?PROT 7.5  ?ALBUMIN 3.7  ? ?No results for input(s): LIPASE, AMYLASE in the last 168 hours. ?No results for input(s): AMMONIA in the last 168 hours. ?Coagulation Profile: ?No results for input(s): INR, PROTIME in the last 168 hours. ?Cardiac Enzymes: ?No results for input(s): CKTOTAL, CKMB, CKMBINDEX, TROPONINI in the last 168 hours. ?BNP (last 3 results) ?No results for input(s): PROBNP in the last 8760 hours. ?HbA1C: ?No results for input(s): HGBA1C in the last 72 hours. ?CBG: ?No results for input(s): GLUCAP in the last 168 hours. ?Lipid Profile: ?No results for input(s): CHOL, HDL,  LDLCALC, TRIG, CHOLHDL, LDLDIRECT in the last 72 hours. ?Thyroid Function Tests: ?Recent Labs  ?  01/12/22 ?1129  ?TSH 2.104  ? ?Anemia Panel: ?No results for input(s): VITAMINB12, FOLATE, FERRITIN, TIBC, IRON, RETICCTPCT in the last 72 hours. ?Urine analysis: ?No results found for: COLORURINE, APPEARANCEUR, [place], [place], [place], [place], [place], KETONESUR, PROTEINUR, [place], NITRITE, LEUKOCYTESUR ?Sepsis Labs: ?@[email or web address] (procalcitonin:4,lacticidven:4) ?) ?Recent Results (from the past 240 hour(s))  ?Resp Panel by RT-PCR (Flu A&B, Covid) Nasopharyngeal Swab     Status: None  ? Collection Time: 01/12/22 12:06 PM  ? Specimen: Nasopharyngeal Swab; Nasopharyngeal(NP) swabs in vial transport medium  ?Result Value Ref Range Status  ? SARS Coronavirus 2 by RT PCR NEGATIVE NEGATIVE Final  ?  Comment: (NOTE) ?SARS-CoV-2 target nucleic acids are NOT DETECTED. ? ?The SARS-CoV-2 RNA is generally detectable in upper respiratory ?specimens during the acute phase of infection. The lowest ?concentration of SARS-CoV-2 viral copies this assay can detect is ?138 copies/mL. A negative result does not preclude SARS-Cov-2 ?infection and should not be used as the sole basis for treatment or ?other patient management decisions. A  negative result may occur with  ?improper specimen collection/handling, submission of specimen other ?than nasopharyngeal swab, presence of viral mutation(s) within the ?areas targeted by this assay, and inadequate number of

## 2022-01-12 NOTE — ED Notes (Signed)
Pt oxygen sat fluctuating from 88%-92% on ear probe. RN applied 2l/min via [place] of oxygen on pt. Provider notified. ?

## 2022-01-12 NOTE — Progress Notes (Signed)
Admission profile updated. ?

## 2022-01-12 NOTE — ED Provider Notes (Signed)
? ?[facility] ?Provider Note ? ? ? Event Date/Time  ? First MD Initiated Contact with Patient 01/12/22 1143   ?  (approximate) ? ? ?History  ? ?Atrial Fibrillation and Shortness of Breath ? ? ?HPI ? ?Ivan Lewis Sr. is a 87 y.o. male with diet-controlled diabetes, history of stroke and hypertension presents emergency department with complaints of shortness of breath since Saturday.  States he walked 2 doors down and became very short of breath, was walking in [name] and went to the back of the store became very short of breath.  This is unusual for him.  Also had some swelling in his ankles which is unusual.  Patient went to see his PCP on Friday for physical and everything was fine.  Went to see PCP today and they were concerned about his heart rate being in a odd rhythm along with his heart rate being low.  Patient states he has had some URI symptoms also.  Denies fever/chills ? ?  ? ? ?Physical Exam  ? ?Triage Vital Signs: ?ED Triage Vitals  ?Enc Vitals Group  ?   BP 01/12/22 1132 (!) 192/70  ?   Pulse Rate 01/12/22 1125 94  ?   Resp 01/12/22 1125 (!) 25  ?   Temp 01/12/22 1143 98.5 ?F (36.9 ?C)  ?   Temp Source 01/12/22 1143 Oral  ?   SpO2 01/12/22 1125 95 %  ?   Weight --   ?   Height 01/12/22 1118 5\' 8"  (1.727 m)  ?   Head Circumference --   ?   Peak Flow --   ?   Pain Score --   ?   Pain Loc --   ?   Pain Edu? --   ?   Excl. in GC? --   ? ? ?Most recent vital signs: ?Vitals:  ? 01/12/22 1230 01/12/22 1242  ?BP: (!) 164/63   ?Pulse: (!) 53 (!) 32  ?Resp: (!) 25 17  ?Temp:    ?SpO2:  95%  ? ? ? ?General: Awake, no distress.   ?CV:  Good peripheral perfusion.  Bradycardic with atrial fib ? resp:  Normal effort. Lungs CTA ?Abd:  No distention.  Nontender ?Other:  Nonpitting edema noted in lower extremities although the ankles are enlarged with some swelling ? ? ?ED Results / Procedures / Treatments  ? ?Labs ?(all labs ordered are listed, but only abnormal results are displayed) ?Labs  Reviewed  ?BASIC METABOLIC PANEL - Abnormal; Notable for the following components:  ?    Result Value  ? Chloride 96 (*)   ? Glucose, Bld 167 (*)   ? Calcium 8.7 (*)   ? All other components within normal limits  ?CBC - Abnormal; Notable for the following components:  ? WBC 11.7 (*)   ? Hemoglobin 12.6 (*)   ? All other components within normal limits  ?HEPATIC FUNCTION PANEL - Abnormal; Notable for the following components:  ? Bilirubin, Direct 0.3 (*)   ? All other components within normal limits  ?TROPONIN I (HIGH SENSITIVITY) - Abnormal; Notable for the following components:  ? Troponin I (High Sensitivity) 49 (*)   ? All other components within normal limits  ?RESP PANEL BY RT-PCR (FLU A&B, COVID) [id number]  ?BRAIN NATRIURETIC PEPTIDE  ?TROPONIN I (HIGH SENSITIVITY)  ? ? ? ?EKG ? ?ekg ? ?RADIOLOGY ?Chest x-ray ? ? ? ?PROCEDURES: ? ? ?03/16/23Critical Care E&M ?Performed by: Marland Kitchen, PA-C ? ?Critical  care provider statement:  ?  Critical care time (minutes):  45 ?  Critical care time was exclusive of:  Separately billable procedures and treating other patients ?  Critical care was necessary to treat or prevent imminent or life-threatening deterioration of the following conditions:  Respiratory failure and cardiac failure ?  Critical care was time spent personally by me on the following activities:  Blood draw for specimens, development of treatment plan with patient or surrogate, evaluation of patient's response to treatment, examination of patient, obtaining history from patient or surrogate, ordering and performing treatments and interventions, ordering and review of laboratory studies, ordering and review of radiographic studies, pulse oximetry, re-evaluation of patient's condition and review of old charts ?  Care discussed with: admitting provider   ?After initial E/M assessment, critical care services were subsequently performed that were exclusive of separately billable procedures or treatment.   ? ? ? ?MEDICATIONS ORDERED IN ED: ?Medications - No data to display ? ? ?IMPRESSION / MDM / ASSESSMENT AND PLAN / ED COURSE  ?I reviewed the triage vital signs and the nursing notes. ?             ?               ? ?Differential diagnosis includes, but is not limited to, new onset A-fib, CHF, bradycardia, MI, covid/CAP ? ?EKG does show bradycardia along with A-fib, this is a new finding from his old EKGs.  See physician read ? ?Chest x-ray was independently reviewed by me, shows vascular congestion and questionable infiltrate, radiologist read as patchy infiltrates and opacities in the airspace.  Unsure if this is due to congestion versus viral pneumonia ? ?CBC is reassuring, has mild elevation of WBC of 11.7, basic metabolic panel has elevated glucose of 167, troponin is slightly elevated at 49 which may indicate a normal value or stress on the heart, hepatic panel is normal, respiratory panel is still pending ? ?Patient's oxygen dropped to 85% falls in the exam room, started to stay low in the 80s, patient was placed on 2 L O2 nasal cannula, oxygen is increased to 95% however his pulse rate still remains low ? ?Consult hospitalist ?Discussed with Dr. Zane Herald.  He will be admitting the patient. ? ? ? ?  ? ? ?FINAL CLINICAL IMPRESSION(S) / ED DIAGNOSES  ? ?Final diagnoses:  ?Bradycardia  ?New onset a-fib [facility])  ?Shortness of breath  ?Hypoxia  ? ? ? ?Rx / DC Orders  ? ?ED Discharge Orders   ? ? None  ? ?  ? ? ? ?Note:  This document was prepared using Dragon voice recognition software and may include unintentional dictation errors. ? ?  ?Faythe Ghee, PA-C ?01/12/22 1327 ? ?  ?Phineas Semen, MD ?01/12/22 1358 ? ?

## 2022-01-12 NOTE — Consult Note (Addendum)
[facility] CARDIOLOGY CONSULT NOTE       Patient ID: Ivan WALSER Sr. MRN: [id number] DOB/AGE: 85-30-1936 85 y.o.  Admit date: 01/12/2022 Referring Physician Dr. Clyde Lundborg Primary Physician Dr. Marcelino Duster Primary Cardiologist none Reason for Consultation new onset Afib  HPI: The patient is an 87 year old male with a past medical history notable for hypertension, type 2 diabetes, AAA, history of CVA in the 1990s, GERD who initially presented to his primary care provider at [facility] clinic due to shortness of breath and they found him to be in atrial fibrillation and heart rate of 38 by EKG.  Cardiology is consulted for further assistance.  The patient presents with his daughter who contributes to the history.  The patient has been feeling short of breath for the past 3 days and noticed significant increase in his lower extremity edema yesterday.  He presented to his PCP for an acute visit today and they found him to be in A-fib with heart rate of 38 so he was sent to the ED.  He reports that 3 days ago he was walking around [place] and was incredibly short of breath which is rather unusual for him. Prior to Saturday he was able to do all his usual activities without significant dyspnea limitation. His daughter notes that now he is dyspneic just with conversation and has a cough.  He denies any pain in his chest, palpitations, dizziness, presyncope, syncope.  Denies orthopnea, PND.  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  He is a former smoker and quit greater than 40 years ago.  He used to smoke 1 pack/day when he was a teenager.  He drinks no more than 1 beer per day.  Denies previous history of seeing a cardiologist, denies previous heart attack or cardiac cath.  Family history is notable for a congenital heart problem in his son who passed at 68 years old and his sleep noting  "his aorta was backwards."  Repeat EKG in the ED shows atrial fibrillation with slow ventricular response, PVCs with rate of 43.  Labs on admission are notable for potassium 3.6, creatinine 1.06, GFR greater than 60.  WBCs 11.7, H&H 12.6/39, platelets 252.  BNP mildly elevated to 351, troponin x149.  TSH 2.1.  Chest x-ray with pulmonary edema, stable cardiomegaly and aortic atherosclerosis.  Review of systems complete and found to be negative unless listed above    Past Medical History:  Diagnosis Date   Diabetes mellitus without complication (HCC)    Hypertension    Stroke [facility])     Past Surgical History:  Procedure Laterality Date   CHOLECYSTECTOMY      (Not in a hospital admission)  Social History   Socioeconomic History   Marital status: Widowed    Spouse name: Not on file   Number of children: Not on file   Years of education: Not on file   Highest education level: Not on file  Occupational History   Not on file  Tobacco Use   Smoking status: Former   Smokeless tobacco: Never  Substance and Sexual Activity   Alcohol use: Yes   Drug use: Never   Sexual activity: Not on file  Other Topics Concern   Not on file  Social History Narrative   Not on file   Social Determinants of Health   Financial Resource Strain: Not on file  Food  Insecurity: Not on file  Transportation Needs: Not on file  Physical Activity: Not on file  Stress: Not on file  Social Connections: Not on file  Intimate Partner Violence: Not on file    Family History  Problem Relation Age of Onset   Cancer Mother    Heart attack Father    Cancer Brother    Heart attack Brother    Heart Problems Son       Review of systems complete and found to be negative unless listed above    PHYSICAL EXAM General: Pleasant elderly Caucasian male, well nourished, in no acute distress.  Sitting upright in ED stretcher with daughter at bedside HEENT:  Normocephalic and atraumatic. Neck:  No JVD.   Lungs: Normal respiratory effort on oxygen by nasal cannula.  Poor air movement, bibasilar crackles  Chest: defib pads in place anteriorly and posteriorly Heart: Bradycardic irregularly irregular rhythm. Normal S1 and S2 without gallops or murmurs. Radial & DP pulses 2+ bilaterally. Abdomen: Non-distended appearing.  Msk: Normal strength and tone for age. Extremities: Warm and well perfused. No clubbing, cyanosis.  2+ bilateral lower extremity edema.  Neuro: Alert and oriented X 3. Psych:  Answers questions appropriately.   Labs:   Lab Results  Component Value Date   WBC 11.7 (H) 01/12/2022   HGB 12.6 (L) 01/12/2022   HCT 39.1 01/12/2022   MCV 91.4 01/12/2022   PLT 252 01/12/2022    Recent Labs  Lab 01/12/22 1129  NA 136  K 3.6  CL 96*  CO2 32  BUN 17  CREATININE 1.06  CALCIUM 8.7*  PROT 7.5  BILITOT 1.2  ALKPHOS 43  ALT 22  AST 24  GLUCOSE 167*   No results found for: CKTOTAL, CKMB, CKMBINDEX, TROPONINI No results found for: CHOL No results found for: HDL No results found for: LDLCALC No results found for: TRIG No results found for: CHOLHDL No results found for: LDLDIRECT    Radiology: [facility] Chest Port 1 View  Result Date: 01/12/2022 CLINICAL DATA:  Shortness of breath. EXAM: PORTABLE CHEST 1 VIEW COMPARISON:  Two-view chest x-ray 08/17/2015 FINDINGS: Heart is enlarged. Atherosclerotic changes are present at the aortic arch. Patchy interstitial and airspace opacities are present bilaterally. No significant consolidation is present. No significant effusion or pneumothorax is present. Degenerative changes are noted in the shoulders. IMPRESSION: 1. Patchy interstitial and airspace opacities bilaterally. This may represent edema or infection. Viral pneumonia can demonstrate this pattern. 2. Cardiomegaly without failure. 3. Aortic atherosclerosis. Electronically Signed   By: Marin Roberts M.D.   On: 01/12/2022 12:05   VAS [place] AAA DUPLEX  Result Date:  12/28/2021 ABDOMINAL AORTA STUDY Patient Name:  Ivan FITHEN Sr.  Date of Exam:   12/28/2021 Medical Rec #: [id number]            Accession #:    [id number] Date of Birth: [birth year]            Patient Gender: M Patient Age:   51 years Exam Location:  [facility] Procedure:      VAS [place] AAA DUPLEX Referring Phys: Levora Dredge --------------------------------------------------------------------------------  Indications: Follow up exam for known AAA. Limitations: Air/bowel gas.  Performing Technologist: Jamse Mead RT, RDMS, RVT  Examination Guidelines: A complete evaluation includes B-mode imaging, spectral Doppler, color Doppler, and power Doppler as needed of all accessible portions of each vessel. Bilateral testing is considered an integral part of a complete examination. Limited examinations for reoccurring indications may be  performed as noted.  Abdominal Aorta Findings: +--------+-------+----------+----------+--------+--------+--------+ LocationAP (cm)Trans (cm)PSV (cm/s)WaveformThrombusComments +--------+-------+----------+----------+--------+--------+--------+ Proximal2.91   2.87      45        biphasic                 +--------+-------+----------+----------+--------+--------+--------+ Mid     2.66   2.53      69        biphasic                 +--------+-------+----------+----------+--------+--------+--------+ Distal  2.59   2.66      57        biphasic                 +--------+-------+----------+----------+--------+--------+--------+ RT CIA  1.5    1.5       122       biphasic                 +--------+-------+----------+----------+--------+--------+--------+ LT CIA  1.4    1.4       80        biphasic                 +--------+-------+----------+----------+--------+--------+--------+  Summary: Abdominal Aorta: Atypical vessel contour and lack of normal vessel tapering correlates with ectasia of the distal abdominal aorta. The largest aortic  diameter remains essentially unchanged compared to prior exam. Previous diameter measurement of the distal abdominal aorta was 2.7 cm obtained on 12/24/19.  *See table(s) above for measurements and observations.  Electronically signed by Levora Dredge MD on 12/28/2021 at 4:16:48 PM.   Final     ECHO no prior  TELEMETRY reviewed by me: Atrial fibrillation with slow ventricular response rate between 35-49.  EKG reviewed by me: atrial fibrillation with slow ventricular response, PVCs with rate of 43.  ASSESSMENT AND PLAN:  The patient is an 87 year old male with a past medical history notable for hypertension, type 2 diabetes, AAA, history of CVA in the 1990s, GERD who initially presented to his primary care provider at [facility] clinic due to shortness of breath and they found him to be in atrial fibrillation and heart rate of 38 by EKG.  Cardiology is consulted for further assistance.  #New onset atrial fibrillation with slow ventricular response #Elevated troponin EKG at his primary care office today showed atrial fibrillation with slow ventricular response, repeat in the ED shows heart rate of 43.  Patient denies presyncope, syncope prior to admission.  Initial troponin mildly elevated to 49, repeats pending.  Patient is without chest pain, this is likely representing demand ischemia and not ACS. -Hold home metoprolol and any AV nodal blockers -Continuous monitoring on telemetry -Echocardiogram as below -Agree with heparin for anticoagulation for now, briefly discussed anticoagulation for stroke risk reduction -CHA2DS2-VASc 6 (age, hypertension, CVA history, diabetes) -No indication for permanent pacemaker at this time  #Elevated BNP Patient states he has been short of breath x3 days and his chronic lower extremity edema that acutely worsened yesterday.  On admission, BNP elevated to 351 and chest x-ray showing patchy edema without significant effusion.  He appears significantly volume  overloaded on exam and has crackles to auscultation. -Agree with IV Lasix 60 x1  -Agree with IV Lasix 40 mg twice daily thereafter. -Obtain echocardiogram complete -Wean oxygen as tolerated -Continue losartan 100 mg -hold home metoprolol tartrate 25 mg twice daily due to bradycardia as above -Hold home HCTZ 25 mg -Strict I's and O's, daily weights  #Hypertension Blood pressure significantly elevated  on admission, peak 189/67. Restart home losartan 100mg   #Type 2 diabetes #History of CVA 1990s -Continue aspirin, agree with lipid panel and other risk factor modification  This patient's plan of care was discussed and created with Dr. Arnoldo Hooker and he is in agreement.  Signed: Rebeca Allegra , PA-C 01/12/2022, 2:33 PM [facility] Cardiology  The patient has had some improvements of symptoms since admission but no current evidence of syncope or significant dizziness.  Heart rate is still quite low and will continue to monitor as per above.  The patient and family have been in agreement above  The patient has been interviewed and examined. I agree with assessment and plan above. Arnoldo Hooker MD [facility]

## 2022-01-13 ENCOUNTER — Inpatient Hospital Stay
Admit: 2022-01-13 | Discharge: 2022-01-13 | Disposition: A | Discharge status: Home / Self Care | Payer: Medicare Other | Attending: Cardiology | Admitting: Cardiology

## 2022-01-13 DIAGNOSIS — I4891 Unspecified atrial fibrillation: Secondary | ICD-10-CM

## 2022-01-13 DIAGNOSIS — E876 Hypokalemia: Secondary | ICD-10-CM

## 2022-01-13 LAB — ECHOCARDIOGRAM COMPLETE
AR max vel: 1.54 cm2
AV Area VTI: 1.86 cm2
AV Area mean vel: 1.57 cm2
AV Mean grad: 6.3 mmHg
AV Peak grad: 12.7 mmHg
Ao pk vel: 1.78 m/s
Area-P 1/2: 3.02 cm2
Height: 68 in
MV VTI: 1.85 cm2
S' Lateral: 2.7 cm
Weight: 3481.5 oz

## 2022-01-13 LAB — BASIC METABOLIC PANEL
Anion gap: 7 (ref 5–15)
BUN: 15 mg/dL (ref 8–23)
CO2: 32 mmol/L (ref 22–32)
Calcium: 8.4 mg/dL — ABNORMAL LOW (ref 8.9–10.3)
Chloride: 98 mmol/L (ref 98–111)
Creatinine, Ser: 1.06 mg/dL (ref 0.61–1.24)
GFR, Estimated: >60 mL/min (ref >60)
Glucose, Bld: 153 mg/dL — ABNORMAL HIGH (ref 70–99)
Potassium: 2.9 mmol/L — ABNORMAL LOW (ref 3.5–5.1)
Sodium: 137 mmol/L (ref 135–145)

## 2022-01-13 LAB — LIPID PANEL
Cholesterol: 120 mg/dL (ref 0–200)
HDL: 34 mg/dL — ABNORMAL LOW (ref >40)
LDL Cholesterol: 73 mg/dL (ref 0–99)
Total CHOL/HDL Ratio: 3.5 RATIO
Triglycerides: 67 mg/dL (ref <150)
VLDL: 13 mg/dL (ref 0–40)

## 2022-01-13 LAB — CBC
HCT: 34 % — ABNORMAL LOW (ref 39.0–52.0)
Hemoglobin: 10.9 g/dL — ABNORMAL LOW (ref 13.0–17.0)
MCH: 29.6 pg (ref 26.0–34.0)
MCHC: 32.1 g/dL (ref 30.0–36.0)
MCV: 92.4 fL (ref 80.0–100.0)
Platelets: 226 10*3/uL (ref 150–400)
RBC: 3.68 MIL/uL — ABNORMAL LOW (ref 4.22–5.81)
RDW: 13.3 % (ref 11.5–15.5)
WBC: 11.9 10*3/uL — ABNORMAL HIGH (ref 4.0–10.5)
nRBC: 0 % (ref 0.0–0.2)

## 2022-01-13 LAB — HEMOGLOBIN A1C
Hgb A1c MFr Bld: 7.2 % — ABNORMAL HIGH (ref 4.8–5.6)
Mean Plasma Glucose: 160 mg/dL

## 2022-01-13 LAB — GLUCOSE, CAPILLARY
Glucose-Capillary: 144 mg/dL — ABNORMAL HIGH (ref 70–99)
Glucose-Capillary: 152 mg/dL — ABNORMAL HIGH (ref 70–99)
Glucose-Capillary: 140 mg/dL — ABNORMAL HIGH (ref 70–99)

## 2022-01-13 LAB — TROPONIN I (HIGH SENSITIVITY)
Troponin I (High Sensitivity): 83 ng/L — ABNORMAL HIGH (ref <18)
Troponin I (High Sensitivity): 80 ng/L — ABNORMAL HIGH (ref <18)
Troponin I (High Sensitivity): 79 ng/L — ABNORMAL HIGH (ref <18)

## 2022-01-13 LAB — HEPARIN LEVEL (UNFRACTIONATED): Heparin Unfractionated: 0.4 IU/mL (ref 0.30–0.70)

## 2022-01-13 MED ORDER — POTASSIUM CHLORIDE 20 MEQ PO PACK
40.0000 meq | PACK | Freq: Once | ORAL | Status: AC
  Administered 2022-01-13: 40 meq via ORAL
  Filled 2022-01-13: qty 2

## 2022-01-13 NOTE — Progress Notes (Signed)
?  Transition of Care (TOC) Screening Note ? ? ?Patient Details  ?Name: Ivan COGBURN Sr. ?Date of Birth: 14-Jul-1935 ? ? ?Transition of Care (TOC) CM/SW Contact:    ?Alberteen Sam, LCSW ?Phone Number: ?01/13/2022, 8:47 AM ? ? ? ?Transition of Care Department [facility]) has reviewed patient and no TOC needs have been identified at this time. We will continue to monitor patient advancement through interdisciplinary progression rounds. If new patient transition needs arise, please place a TOC consult. ? Pricilla Riffle, [place] ?[phone] ? ?

## 2022-01-13 NOTE — Hospital Course (Addendum)
This 87 years old male with PMH significant for hypertension, type 2 diabetes, AAA, history of CVA in 1990, GERD presented in the ED with A-fib with slow ventricular rate.  Patient initially presented to [facility] clinic for shortness of breath and found to have atrial fibrillation with slow heart rate.  Patient reports worsening shortness of breath with exertion which is unusual for him. ?Patient was admitted for A-fib with slow ventricular rate. Cardiology was consulted. Patient also found to have CHF exacerbation given SOB on exertion, bilateral leg edema, weight gain and slightly elevated BNP.  Patient was started on IV Lasix.  Patient continues to get better.  He is euvolemic on discharge.  Patient is cleared from cardiology to be discharged on Eliquis, metoprolol was discontinued given bradycardia.  Patient will follow-up with cardiology outpatient. ?

## 2022-01-13 NOTE — Assessment & Plan Note (Signed)
Patient presented with hypoxia, SPO2 88% on room air which improved to 95% on 2 L, likely secondary to CHF. She is also found to have mild leukocytosis, denies any fever, procalcitonin normal.  Less likely pneumonia. ?Continue supplemental oxygen to maintain saturation above 93% and wean as tolerated. ?Continue as needed albuterol and Mucinex. ?

## 2022-01-13 NOTE — Consult Note (Signed)
? ?  Heart Failure Nurse Navigator Note ?  ?Echocardiogram results are pending on this admission and there were no echocardiogram results on record. ? ? ?He presented from his PCPs office after he had presented there with a 3-day history of worsening shortness of breath and lower extremity edema.  EKG was noted to be bradycardic atrial fibrillation. ? ?Comorbidities: ? ?New onset atrial fibrillation ?Hypertension ?Abdominal aortic aneurysm without rupture ?GERD ?Stroke ?Diabetes ? ?Medications: ? ?Aspirin 81 mg daily ?Furosemide 40 mg IV every 12 hours ?Losartan 100 mg daily ? ?Metoprolol tartrate is currently on hold. ? ? ? ?Labs: ? ?Sodium 137, potassium 2.9, chloride 98, CO2 32, BUN 15, creatinine 1.06, estimated GFR greater than 60, BNP 351 ? ?Intake not documented ?Output 2275 ?Weight 98.7 kg ? ?Initial meeting with patient on this admission, he also had several family members present in the room, I told him that I would come back later but he asked that I speak to him and his family at this time. ? ?Explained to him that I did not know what the results of the echocardiogram were as of yet.  Described the 2 different types of heart failure and shown diagrams and explained that he had come in was classic symptoms of heart failure that include shortness of breath, PND orthopnea, lower extremity edema and weight gain.  Also discussed how the slow atrial fibrillation. ? ?Discussed weighing daily and reporting a 3 pound weight gain overnight or 5 pounds total within the week. ? ?Also discussed the importance of low-sodium diet, family member interjected that he had been eating a lot of canned soups and convenience frozen dinners.  Discussed sticking with the 2000 mg sodium diet daily, making healthy choices when eating at restaurants. ? ?Also discussed the importance of sticking with the fluid restriction and all the different types of liquids that was involved in that also including ice cream, Jell-O and  puddings. ? ?Also discussed the outpatient heart failure clinic of which she has an appointment on March 22 at 3 PM. ? ?Was given the living with heart failure teaching booklet, zone magnet and information on low-sodium. ? ?Tresa Endo RN CHFN ? ?

## 2022-01-13 NOTE — Assessment & Plan Note (Signed)
Replaced.  Continue to monitor °

## 2022-01-13 NOTE — Progress Notes (Signed)
*  PRELIMINARY RESULTS* ?Echocardiogram ?2D Echocardiogram has been performed. ? ?Gaye Scorza, [place] ?01/13/2022, 7:55 AM ?

## 2022-01-13 NOTE — Assessment & Plan Note (Signed)
Continue Cozaar, IV Lasix. ?Hold metoprolol due to bradycardia. ?Continue IV hydralazine as needed. ?

## 2022-01-13 NOTE — Assessment & Plan Note (Addendum)
He is found to have elevated troponin likely due to demand ischemia in the setting of CHF and A-fib. ?Troponin trending down 49> 48 less likely ACS. ?Continue aspirin, lipid profile normal. ?Continue IV heparin for atrial fibrillation. ? ?

## 2022-01-13 NOTE — Consult Note (Addendum)
ANTICOAGULATION CONSULT NOTE ? ?Pharmacy Consult for IV heparin ?Indication: atrial fibrillation ? ?Allergies  ?Allergen Reactions  ? Clindamycin/Lincomycin Other (See Comments)  ?  unknown  ? Enalapril Maleate   ?  Other reaction(s): Cough  ? Erythromycin Other (See Comments)  ?  unknown  ? Nitrofurantoin   ?  Other reaction(s): Unknown  ? Sulfa Antibiotics Other (See Comments)  ?  unknown  ? Penicillins Rash  ? Phenobarbital Rash  ? Phenylbutazones Rash  ? ? ?Patient Measurements: ?Height: 5\' 8"  (172.7 cm) ?Weight: 98.7 kg (217 lb 9.5 oz) ?IBW/kg (Calculated) : 68.4 ?Heparin Dosing Weight: 100kg ? ?Vital Signs: ?Temp: 97.8 ?F (36.6 ?C) (03/15 0314) ?Temp Source: Oral (03/14 2030) ?BP: 145/61 (03/15 0314) ?Pulse Rate: 47 (03/15 0314) ? ?Labs: ?Recent Labs  ?  01/12/22 ?1129 01/12/22 ?1634 01/12/22 ?2252 01/13/22 ?0414 01/13/22 ?0647  ?HGB 12.6*  --   --  10.9*  --   ?HCT 39.1  --   --  34.0*  --   ?PLT 252  --   --  226  --   ?APTT  --  35  --   --   --   ?HEPARINUNFRC  --   --  0.47  --  0.40  ?CREATININE 1.06  --   --  1.06  --   ?TROPONINIHS 49* 48*  --   --   --   ? ? ? ?Estimated Creatinine Clearance: 59.1 mL/min (by C-G formula based on SCr of 1.06 mg/dL). ? ? ?Medical History: ?Past Medical History:  ?Diagnosis Date  ? Diabetes mellitus without complication (HCC)   ? Hypertension   ? Stroke [facility])   ? ? ?Medications:  ?PTA: Clopidogrel  75mg  PO daily ?Inpatient: Heparin drip (3/14 >>>) ?Allergies: No AC/APT related allergies ? ?Assessment: ?87 y.o. male with medical history significant of HTN, stroke, GERD, AAA, sinus bradycardia, who presents with shortness of breath. Admitted with new onset Afib. Pharmacy consulted for management of heparin drip. Estimated CHADSVASC 6. ? ?Baseline Labs: Hgb, plts (252) stable, aPTT 35 ? ?CBC stable 3/15. Plts 226. ? ?Date Time aPTT/HL Rate/Comment ?3/14 2252 0.47  1400 units/hr ; therapeutic ?3/14  0647 0.40  1400 units/hr ; therapeutic  ? ?Goal of Therapy:  ?Heparin level  0.3-0.7 units/ml ?Monitor platelets by anticoagulation protocol: Yes ?  ?Plan: Therapeutic x 2. ?Continue heparin 1400 units/hr ?Daily heparin level, CBC ?Follow up plans for transitioning to PO anticoagulation. ? ? ?4/14, PharmD ?Pharmacy Resident  ?01/13/2022 ?8:04 AM ? ? ? ? ?

## 2022-01-13 NOTE — Assessment & Plan Note (Signed)
Continue pantoprazole. °

## 2022-01-13 NOTE — Assessment & Plan Note (Signed)
Patient denies any history of congestive heart failure. ?Likely CHF exacerbation given weight gain, SOB on exertion,  bilateral leg edema  JVD+ and crackles on exam, CXR: Possibly pulmonary edema consistent with acute CHF.  No 2D echo on record.  Given long history of HTN may have diastolic CHF. ?Continue Lasix 40 mg IV twice daily. ?Obtain 2D echocardiogram,  ?Daily weight,  intake output charting, low-salt diet. ? ? ?Intake/Output Summary (Last 24 hours) at 01/13/2022 1048 ?Last data filed at 01/13/2022 0900 ?Gross per 24 hour  ?Intake 481.15 ml  ?Output 2975 ml  ?Net -2493.85 ml  ? ? ?

## 2022-01-13 NOTE — Assessment & Plan Note (Signed)
Follow-up outpatient with Dr. Lorretta Harp ?

## 2022-01-13 NOTE — Progress Notes (Signed)
15:30- Patient called out c/o throbbing in chest. States, "it feels like I can feel my heart on the outside." HR AFIB in 50s on tele.  ?PA Jodie Echevaria and Dr. Lucianne Muss notified. EKG performed with Tonna Corner and she took EKG to review with Dr. Gwen Pounds.  ?Incident lasted about 15 minutes and then resolved on its own.  ?

## 2022-01-13 NOTE — Assessment & Plan Note (Addendum)
Patient reports progressive shortness of breath on exertion, weight gain and bilateral leg edema. He is found to have A-fib with slow ventricular rate on EKG. ?Heart rate remains between 32-57.  Hemodynamically stable. ?Metoprolol discontinued.  Glucagon given.   ?CHA2DS2-VASc Score 7.  No indication for pacemaker at this time. ?Cardiology is consulted.  Continued IV heparin gtt. ?TSH normal. Transitioned on Eliquis. ?Echocardiogram shows LVEF 55 to 60%. ?

## 2022-01-13 NOTE — Progress Notes (Addendum)
?[facility] CONSULT NOTE  ? ?    ?Patient ID: ?Ivan TABBERT Sr. ?MRN: [id number] ?DOB/AGE: Feb 27, 1935 87 y.o. ? ?Admit date: 01/12/2022 ?Referring Physician Dr. Clyde Lewis ?Primary Physician Dr. Marcelino Lewis ?Primary Cardiologist none ?Reason for Consultation new onset Afib ? ?HPI: The patient is an 87 year old male with a past medical history notable for hypertension, type 2 diabetes, AAA, history of CVA in the 1990s, GERD who initially presented to his primary care provider at [facility] clinic due to shortness of breath and they found him to be in atrial fibrillation and heart rate of 38 by EKG.  Cardiology is consulted for further assistance. ? ?Interval history:  ?- diuresed well overnight, net neg 2.2L ?- continued low HR while sleeping, low in the 30s without long pauses, in AF.  ?- feels his breathing and lower extremity edema have somewhat improved overnight, but not at baseline.  ?- denies chest pain, dizziness, presyncope, syncope.  ?- states over the past month after his wife's passing, he as eaten mostly canned soups and frozen dinners ? ?Review of systems complete and found to be negative unless listed above  ? ? ?Past Medical History:  ?Diagnosis Date  ? Diabetes mellitus without complication (HCC)   ? Hypertension   ? Stroke [facility])   ?  ?Past Surgical History:  ?Procedure Laterality Date  ? CHOLECYSTECTOMY    ?  ?Medications Prior to Admission  ?Medication Sig Dispense Refill Last Dose  ? aspirin EC 81 MG tablet Take 81 mg by mouth daily.     ? hydrochlorothiazide (HYDRODIURIL) 25 MG tablet Take 25 mg by mouth daily.   01/11/2022 at Unknown  ? hydrocortisone 2.5 % cream Apply 1 application. topically 2 (two) times daily as needed (itching).   Unknown at PRN  ? losartan (COZAAR) 100 MG tablet Take 100 mg by mouth daily.   01/11/2022 at Unknown  ? meloxicam (MOBIC) 15 MG tablet Take 15 mg by mouth daily as needed for pain.   Unknown at PRN  ? metoprolol tartrate (LOPRESSOR) 25 MG tablet Take 25  mg by mouth 2 (two) times daily.   01/11/2022 at Unknown  ? Omega-3 Fatty Acids (FISH OIL) 1000 MG CAPS Take 1 capsule by mouth 2 (two) times daily.     ? omeprazole (PRILOSEC) 40 MG capsule Take 40 mg by mouth daily.   01/11/2022 at Unknown  ? vitamin B-12 (CYANOCOBALAMIN) 1000 MCG tablet Take 1,000 mcg by mouth daily.     ? ? ?Social History  ? ?Socioeconomic History  ? Marital status: Widowed  ?  Spouse name: Not on file  ? Number of children: Not on file  ? Years of education: Not on file  ? Highest education level: Not on file  ?Occupational History  ? Not on file  ?Tobacco Use  ? Smoking status: Former  ? Smokeless tobacco: Never  ?Substance and Sexual Activity  ? Alcohol use: Yes  ? Drug use: Never  ? Sexual activity: Not on file  ?Other Topics Concern  ? Not on file  ?Social History Narrative  ? Not on file  ? ?Social Determinants of Health  ? ?Financial Resource Strain: Not on file  ?Food Insecurity: Not on file  ?Transportation Needs: Not on file  ?Physical Activity: Not on file  ?Stress: Not on file  ?Social Connections: Not on file  ?Intimate Partner Violence: Not on file  ?  ?Family History  ?Problem Relation Age of Onset  ? Cancer Mother   ?  Heart attack Father   ? Cancer Brother   ? Heart attack Brother   ? Heart Problems Son   ?  ? ? ?Review of systems complete and found to be negative unless listed above  ? ? ?PHYSICAL EXAM ?General: Pleasant elderly Caucasian male, well nourished, in no acute distress.  Sitting upright PCU bed with daughter at bedside ?HEENT:  Normocephalic and atraumatic. ?Neck:  No JVD.  ?Lungs: Normal respiratory effort on oxygen by nasal cannula. Better air movement, bibasilar crackles still present ?Heart: Bradycardic irregularly irregular rhythm. Normal S1 and S2 without gallops or murmurs. Radial & DP pulses 2+ bilaterally. ?Abdomen: Non-distended appearing.  ?Msk: Normal strength and tone for age. ?Extremities: Warm and well perfused. No clubbing, cyanosis.  2+ bilateral  lower extremity edema, L>R - improving somewhat ?Neuro: Alert and oriented X 3. ?Psych:  Answers questions appropriately.  ? ?Labs: ?  ?Lab Results  ?Component Value Date  ? WBC 11.9 (H) 01/13/2022  ? HGB 10.9 (L) 01/13/2022  ? HCT 34.0 (L) 01/13/2022  ? MCV 92.4 01/13/2022  ? PLT 226 01/13/2022  ?  ?Recent Labs  ?Lab 01/12/22 ?1129 01/13/22 ?0414  ?NA 136 137  ?K 3.6 2.9*  ?CL 96* 98  ?CO2 32 32  ?BUN 17 15  ?CREATININE 1.06 1.06  ?CALCIUM 8.7* 8.4*  ?PROT 7.5  --   ?BILITOT 1.2  --   ?ALKPHOS 43  --   ?ALT 22  --   ?AST 24  --   ?GLUCOSE 167* 153*  ? ? ?No results found for: CKTOTAL, CKMB, CKMBINDEX, TROPONINI  ?Lab Results  ?Component Value Date  ? CHOL 120 01/13/2022  ? ?Lab Results  ?Component Value Date  ? HDL 34 (L) 01/13/2022  ? ?Lab Results  ?Component Value Date  ? LDLCALC 73 01/13/2022  ? ?Lab Results  ?Component Value Date  ? TRIG 67 01/13/2022  ? ?Lab Results  ?Component Value Date  ? CHOLHDL 3.5 01/13/2022  ? ?No results found for: LDLDIRECT  ?  ?Radiology: [facility] Chest Port 1 View ? ?Result Date: 01/12/2022 ?CLINICAL DATA:  Shortness of breath. EXAM: PORTABLE CHEST 1 VIEW COMPARISON:  Two-view chest x-ray 08/17/2015 FINDINGS: Heart is enlarged. Atherosclerotic changes are present at the aortic arch. Patchy interstitial and airspace opacities are present bilaterally. No significant consolidation is present. No significant effusion or pneumothorax is present. Degenerative changes are noted in the shoulders. IMPRESSION: 1. Patchy interstitial and airspace opacities bilaterally. This may represent edema or infection. Viral pneumonia can demonstrate this pattern. 2. Cardiomegaly without failure. 3. Aortic atherosclerosis. Electronically Signed   By: Ivan Lewis M.D.   On: 01/12/2022 12:05  ? ?VAS [place] AAA DUPLEX ? ?Result Date: 12/28/2021 ?ABDOMINAL AORTA STUDY Patient Name:  Ivan MISSEY Sr.  Date of Exam:   12/28/2021 Medical Rec #: [id number]            Accession #:    [id number] Date of Birth:  01/23/1935            Patient Gender: M Patient Age:   26 years Exam Location:  [facility] Procedure:      VAS [place] AAA DUPLEX Referring Phys: Levora Dredge --------------------------------------------------------------------------------  Indications: Follow up exam for known AAA. Limitations: Air/bowel gas.  Performing Technologist: Jamse Mead RT, RDMS, RVT  Examination Guidelines: A complete evaluation includes B-mode imaging, spectral Doppler, color Doppler, and power Doppler as needed of all accessible portions of each vessel. Bilateral testing is considered an integral part  of a complete examination. Limited examinations for reoccurring indications may be performed as noted.  Abdominal Aorta Findings: +--------+-------+----------+----------+--------+--------+--------+ LocationAP (cm)Trans (cm)PSV (cm/s)WaveformThrombusComments +--------+-------+----------+----------+--------+--------+--------+ Proximal2.91   2.87      45        biphasic                 +--------+-------+----------+----------+--------+--------+--------+ Mid     2.66   2.53      69        biphasic                 +--------+-------+----------+----------+--------+--------+--------+ Distal  2.59   2.66      57        biphasic                 +--------+-------+----------+----------+--------+--------+--------+ RT CIA  1.5    1.5       122       biphasic                 +--------+-------+----------+----------+--------+--------+--------+ LT CIA  1.4    1.4       80        biphasic                 +--------+-------+----------+----------+--------+--------+--------+  Summary: Abdominal Aorta: Atypical vessel contour and lack of normal vessel tapering correlates with ectasia of the distal abdominal aorta. The largest aortic diameter remains essentially unchanged compared to prior exam. Previous diameter measurement of the distal abdominal aorta was 2.7 cm obtained on 12/24/19.  *See table(s)  above for measurements and observations.  Electronically signed by Levora DredgeGregory Schnier MD on 12/28/2021 at 4:16:48 PM.   Final    ? ?ECHO no prior ? ?TELEMETRY reviewed by me: Atrial fibrillation with slow ventricular response rate 30s overnight, improved to high 40

## 2022-01-13 NOTE — Progress Notes (Signed)
CRITICAL VALUE STICKER ? ?CRITICAL VALUE: Troponin 83 (was 48) ? ?RECEIVER Tretha Sciara RN  ? ?DATE & TIME NOTIFIED: 01/13/22 17:25 ? ?MD NOTIFIED: Dr. Lucianne Muss ? ?TIME OF NOTIFICATION:17:30 ? ?RESPONSE:  No new orders.  ?

## 2022-01-13 NOTE — Assessment & Plan Note (Signed)
Continue aspirin and statins. ?Patient does not have any residual deficits. ?

## 2022-01-13 NOTE — Progress Notes (Addendum)
?Progress Note ? ? ?Patient: Ivan Lewis. RUE:[id number] DOB: 15-Jul-1935 DOA: 01/12/2022     1 ? ?DOS: the patient was seen and examined on 01/13/2022 ?  ?Brief hospital course: ?This 87 years old male with PMH significant for hypertension, type 2 diabetes, AAA, history of CVA in 1990, GERD presented in the ED with A-fib with slow ventricular rate.  Patient initially presented to [facility] clinic for shortness of breath and found to have atrial fibrillation with slow heart rate.  Patient reports worsening shortness of breath with exertion which is unusual for him. ?Patient is admitted for A-fib with slow ventricular rate.  Cardiology is consulted.  Patient also found to be having CHF exacerbation given SOB on exertion, bilateral leg edema, weight gain and slightly elevated BNP.  Patient is started on IV Lasix. ? ?Assessment and Plan: ?* New onset atrial fibrillation (HCC) ?Patient reports progressive shortness of breath on exertion, weight gain and bilateral leg edema. He is found to have A-fib with slow ventricular rate on EKG. ?Heart rate remains between 32-57.  Hemodynamically stable. ?Metoprolol discontinued.  Glucagon given.   ?CHA2DS2-VASc Score 7. ?Cardiology is consulted.  Continue IV heparin gtt. ?TSH normal. ?2D echocardiogram completed, report pending. ? ?Acute CHF (HCC) ?Patient denies any history of congestive heart failure. ?Likely CHF exacerbation given weight gain, SOB on exertion,  bilateral leg edema  JVD+ and crackles on exam, CXR: Possibly pulmonary edema consistent with acute CHF.  No 2D echo on record.  Given long history of HTN may have diastolic CHF. ?Continue Lasix 40 mg IV twice daily. ?Obtain 2D echocardiogram,  ?Daily weight,  intake output charting, low-salt diet. ? ? ?Intake/Output Summary (Last 24 hours) at 01/13/2022 1048 ?Last data filed at 01/13/2022 0900 ?Gross per 24 hour  ?Intake 481.15 ml  ?Output 2975 ml  ?Net -2493.85 ml  ? ? ? ?Essential hypertension ?Continue Cozaar, IV  Lasix. ?Hold metoprolol due to bradycardia. ?Continue IV hydralazine as needed. ? ?Acute respiratory failure with hypoxia (HCC) ?Patient presented with hypoxia, SPO2 88% on room air which improved to 95% on 2 L, likely secondary to CHF. She is also found to have mild leukocytosis, denies any fever, procalcitonin normal.  Less likely pneumonia. ?Continue supplemental oxygen to maintain saturation above 93% and wean as tolerated. ?Continue as needed albuterol and Mucinex. ? ?Hypokalemia ?Replaced.  Continue to monitor ? ?Elevated troponin ?He is found to have elevated troponin likely due to demand ischemia in the setting of CHF and A-fib. ?Troponin trending down 49> 48 less likely ACS. ?Continue aspirin, lipid profile normal. ?Continue IV heparin for atrial fibrillation. ? ? ?Stroke [facility]) ?Continue aspirin and statins. ?Patient does not have any residual deficits. ? ?GERD (gastroesophageal reflux disease) ?Continue pantoprazole. ? ?AAA (abdominal aortic aneurysm) without rupture ?Follow-up outpatient with Dr. Lorretta Harp ? ? ? ?Subjective: Patient was seen and examined at bedside.  Overnight events noted. ?Patient denies any chest pain, reports having shortness of breath with exertion. ?He denies any palpitations, dizziness. ? ?Physical Exam: ?Vitals:  ? 01/12/22 2343 01/13/22 0314 01/13/22 0450 01/13/22 0812  ?BP: (!) 159/63 (!) 145/61  (!) 155/62  ?Pulse: (!) 49 (!) 47  (!) 41  ?Resp: 20 20  16   ?Temp: 98.7 ?F (37.1 ?C) 97.8 ?F (36.6 ?C)    ?TempSrc:      ?SpO2: 99% 99%  98%  ?Weight:   98.7 kg   ?Height:      ? ?General exam: Appears comfortable, not in any acute distress. ?Respiratory :  CTA bilaterally, no wheezing, no crackles, normal respiratory effort. ?Cardiovascular : S1-S2 heard, regular rate and rhythm, no murmur. ?Gastrointestinal : Abdomen is soft, nontender, nondistended, BS+ ?Central nervous system: Alert, oriented x3, no focal neurological deficits. ?Extremities: Edema+no cyanosis, no  clubbing. ?Psychiatry: Mood, insight, judgment normal. ? ? ?Data Reviewed: ?I have Reviewed nursing notes, Vitals, and Lab results since pt's last encounter. Pertinent lab results CBC, CMP, Trope, BNP ?I have ordered test including CBC, BMP ?I have independently visualized and interpreted imaging chest x-ray which showed finding consistent with CHF. ?I have independently visualized and interpreted EKG which showed EKG: atrial fibrillation, rate 53. ?I have reviewed the last note from cardiology, hospitalist ?I have discussed pt's care plan and test results with patient and family.  ? ?Family Communication: Family at bedside ? ?Disposition: ?Status is: Inpatient ?Remains inpatient appropriate because:  ?Admitted for atrial fibrillation with slow ventricular rate and CHF exacerbation requiring IV Lasix and cardiology evaluation. ? ?Planned Discharge Destination: Home ? ?Time spent: 50 minutes ? ?Author: ?Cipriano Bunker, MD ?01/13/2022 11:20 AM ? ?For on call review [email or web address].  ?

## 2022-01-14 ENCOUNTER — Other Ambulatory Visit (HOSPITAL_COMMUNITY): Payer: Self-pay

## 2022-01-14 DIAGNOSIS — I4891 Unspecified atrial fibrillation: Secondary | ICD-10-CM | POA: Diagnosis not present

## 2022-01-14 LAB — BASIC METABOLIC PANEL
Anion gap: 9 (ref 5–15)
BUN: 14 mg/dL (ref 8–23)
CO2: 32 mmol/L (ref 22–32)
Calcium: 8.4 mg/dL — ABNORMAL LOW (ref 8.9–10.3)
Chloride: 95 mmol/L — ABNORMAL LOW (ref 98–111)
Creatinine, Ser: 1.02 mg/dL (ref 0.61–1.24)
GFR, Estimated: >60 mL/min (ref >60)
Glucose, Bld: 135 mg/dL — ABNORMAL HIGH (ref 70–99)
Potassium: 3.2 mmol/L — ABNORMAL LOW (ref 3.5–5.1)
Sodium: 136 mmol/L (ref 135–145)

## 2022-01-14 LAB — CBC
HCT: 34.4 % — ABNORMAL LOW (ref 39.0–52.0)
Hemoglobin: 11.3 g/dL — ABNORMAL LOW (ref 13.0–17.0)
MCH: 29.5 pg (ref 26.0–34.0)
MCHC: 32.8 g/dL (ref 30.0–36.0)
MCV: 89.8 fL (ref 80.0–100.0)
Platelets: 240 10*3/uL (ref 150–400)
RBC: 3.83 MIL/uL — ABNORMAL LOW (ref 4.22–5.81)
RDW: 13.4 % (ref 11.5–15.5)
WBC: 9.3 10*3/uL (ref 4.0–10.5)
nRBC: 0 % (ref 0.0–0.2)

## 2022-01-14 LAB — HEPARIN LEVEL (UNFRACTIONATED)
Heparin Unfractionated: 0.24 IU/mL — ABNORMAL LOW (ref 0.30–0.70)
Heparin Unfractionated: <0.1 IU/mL — ABNORMAL LOW (ref 0.30–0.70)

## 2022-01-14 LAB — GLUCOSE, CAPILLARY: Glucose-Capillary: 128 mg/dL — ABNORMAL HIGH (ref 70–99)

## 2022-01-14 MED ORDER — POTASSIUM CHLORIDE 20 MEQ PO PACK
40.0000 meq | PACK | Freq: Once | ORAL | Status: AC
  Administered 2022-01-14: 40 meq via ORAL
  Filled 2022-01-14: qty 2

## 2022-01-14 MED ORDER — APIXABAN 5 MG PO TABS: 5.0000 mg | ORAL_TABLET | Freq: Two times a day (BID) | ORAL | 5 refills | Status: AC

## 2022-01-14 MED ORDER — FUROSEMIDE 40 MG PO TABS: 40.0000 mg | ORAL_TABLET | Freq: Every day | ORAL | 1 refills | Status: AC

## 2022-01-14 MED ORDER — HEPARIN BOLUS VIA INFUSION
1500.0000 [IU] | Freq: Once | INTRAVENOUS | Status: AC
  Administered 2022-01-14: 1500 [IU] via INTRAVENOUS
  Filled 2022-01-14: qty 1500

## 2022-01-14 MED ORDER — IRBESARTAN 150 MG PO TABS
150.0000 mg | ORAL_TABLET | Freq: Every day | ORAL | Status: DC
  Administered 2022-01-14: 150 mg via ORAL
  Filled 2022-01-14: qty 1

## 2022-01-14 NOTE — Progress Notes (Signed)
?[facility] CONSULT NOTE  ? ?    ?Patient ID: ?Ivan CLAW Sr. ?MRN: [id number] ?DOB/AGE: 85/17/1936 85 y.o. ? ?Admit date: 01/12/2022 ?Referring Physician Dr. Clyde Lundborg ?Primary Physician Dr. Marcelino Duster ?Primary Cardiologist none ?Reason for Consultation new onset Afib ? ?HPI: The patient is an 87 year old male with a past medical history notable for hypertension, type 2 diabetes, AAA, history of CVA in the 1990s, GERD who initially presented to his primary care provider at [facility] clinic due to shortness of breath and they found him to be in atrial fibrillation and heart rate of 38 by EKG.  Cardiology is consulted for further assistance. ? ?Interval history:  ?- diuresed well overnight, net neg over 3 L ?-Heart rate continues to be okay at 80 bpm today ?-Blood pressure slightly elevated ?Review of systems complete and found to be negative unless listed above  ? ? ?Past Medical History:  ?Diagnosis Date  ? Diabetes mellitus without complication (HCC)   ? Hypertension   ? Stroke [facility])   ?  ?Past Surgical History:  ?Procedure Laterality Date  ? CHOLECYSTECTOMY    ?  ?No medications prior to admission.  ? ? ?Social History  ? ?Socioeconomic History  ? Marital status: Widowed  ?  Spouse name: Not on file  ? Number of children: Not on file  ? Years of education: Not on file  ? Highest education level: Not on file  ?Occupational History  ? Not on file  ?Tobacco Use  ? Smoking status: Former  ? Smokeless tobacco: Never  ?Substance and Sexual Activity  ? Alcohol use: Yes  ? Drug use: Never  ? Sexual activity: Not on file  ?Other Topics Concern  ? Not on file  ?Social History Narrative  ? Not on file  ? ?Social Determinants of Health  ? ?Financial Resource Strain: Not on file  ?Food Insecurity: Not on file  ?Transportation Needs: Not on file  ?Physical Activity: Not on file  ?Stress: Not on file  ?Social Connections: Not on file  ?Intimate Partner Violence: Not on file  ?  ?Family History  ?Problem Relation  Age of Onset  ? Cancer Mother   ? Heart attack Father   ? Cancer Brother   ? Heart attack Brother   ? Heart Problems Son   ?  ? ? ?Review of systems complete and found to be negative unless listed above  ? ? ?PHYSICAL EXAM ?General: Pleasant elderly Caucasian male, well nourished, in no acute distress.  Sitting upright PCU bed with daughter at bedside ?HEENT:  Normocephalic and atraumatic. ?Neck:  No JVD.  ?Lungs: Normal respiratory effort on oxygen by nasal cannula. Better air movement, bibasilar crackles still present ?Heart: Bradycardic irregularly irregular rhythm. Normal S1 and S2 without gallops or murmurs. Radial & DP pulses 2+ bilaterally. ?Abdomen: Non-distended appearing.  ?Msk: Normal strength and tone for age. ?Extremities: Warm and well perfused. No clubbing, cyanosis.  2+ bilateral lower extremity edema, L>R - improving somewhat ?Neuro: Alert and oriented X 3. ?Psych:  Answers questions appropriately.  ? ?Labs: ?  ?Lab Results  ?Component Value Date  ? WBC 9.3 01/14/2022  ? HGB 11.3 (L) 01/14/2022  ? HCT 34.4 (L) 01/14/2022  ? MCV 89.8 01/14/2022  ? PLT 240 01/14/2022  ?  ?Recent Labs  ?Lab 01/12/22 ?1129 01/13/22 ?0414 01/14/22 ?0606  ?NA 136   < > 136  ?K 3.6   < > 3.2*  ?CL 96*   < > 95*  ?CO2  32   < > 32  ?BUN 17   < > 14  ?CREATININE 1.06   < > 1.02  ?CALCIUM 8.7*   < > 8.4*  ?PROT 7.5  --   --   ?BILITOT 1.2  --   --   ?ALKPHOS 43  --   --   ?ALT 22  --   --   ?AST 24  --   --   ?GLUCOSE 167*   < > 135*  ? < > = values in this interval not displayed.  ? ? ?No results found for: CKTOTAL, CKMB, CKMBINDEX, TROPONINI  ?Lab Results  ?Component Value Date  ? CHOL 120 01/13/2022  ? ?Lab Results  ?Component Value Date  ? HDL 34 (L) 01/13/2022  ? ?Lab Results  ?Component Value Date  ? LDLCALC 73 01/13/2022  ? ?Lab Results  ?Component Value Date  ? TRIG 67 01/13/2022  ? ?Lab Results  ?Component Value Date  ? CHOLHDL 3.5 01/13/2022  ? ?No results found for: LDLDIRECT  ?  ?[facility] ? ?Result Date: 01/12/2022 ?CLINICAL DATA:  Shortness of breath. EXAM: PORTABLE CHEST 1 VIEW COMPARISON:  Two-view chest x-ray 08/17/2015 FINDINGS: Heart is enlarged. Atherosclerotic changes are present at the aortic arch. Patchy interstitial and airspace opacities are present bilaterally. No significant consolidation is present. No significant effusion or pneumothorax is present. Degenerative changes are noted in the shoulders. IMPRESSION: 1. Patchy interstitial and airspace opacities bilaterally. This may represent edema or infection. Viral pneumonia can demonstrate this pattern. 2. Cardiomegaly without failure. 3. Aortic atherosclerosis. Electronically Signed   By: Marin Robertshristopher  Mattern M.D.   On: 01/12/2022 12:05  ? ?VAS US AAA DUPLEX ? ?Result Date: 12/28/2021 ?ABDOMINAL AORTA STUDY Patient Name:  Ivan HeringGerald D Idleman Sr.  Date of Exam:   12/28/2021 Medical Rec #: [id number]            Accession #:    [id number] Date of Birth: 1935-04-21            Patient Gender: M Patient Age:   [age] years Exam Location:  [facility] Procedure:      VAS US AAA DUPLEX Referring Phys: Levora DredgeGREGORY SCHNIER --------------------------------------------------------------------------------  Indications: Follow up exam for known AAA. Limitations: Air/bowel gas.  Performing Technologist: Jamse MeadWilliam Hosmer RT, RDMS, RVT  Examination Guidelines: A complete evaluation includes B-mode imaging, spectral Doppler, color Doppler, and power Doppler as needed of all accessible portions of each vessel. Bilateral testing is considered an integral part of a complete examination. Limited examinations for reoccurring indications may be performed as noted.  Abdominal Aorta Findings: +--------+-------+----------+----------+--------+--------+--------+ LocationAP (cm)Trans (cm)PSV (cm/s)WaveformThrombusComments +--------+-------+----------+----------+--------+--------+--------+ Proximal2.91   2.87      45        biphasic                  +--------+-------+----------+----------+--------+--------+--------+ Mid     2.66   2.53      69        biphasic                 +--------+-------+----------+----------+--------+--------+--------+ Distal  2.59   2.66      57        biphasic                 +--------+-------+----------+----------+--------+--------+--------+ RT CIA  1.5    1.5       122       biphasic                 +--------+-------+----------+----------+--------+--------+--------+  LT CIA  1.4    1.4       80        biphasic                 +--------+-------+----------+----------+--------+--------+--------+  Summary: Abdominal Aorta: Atypical vessel contour and lack of normal vessel tapering correlates with ectasia of the distal abdominal aorta. The largest aortic diameter remains essentially unchanged compared to prior exam. Previous diameter measurement of the distal abdominal aorta was 2.7 cm obtained on 12/24/19.  *See table(s) above for measurements and observations.  Electronically signed by Levora Dredge MD on 12/28/2021 at 4:16:48 PM.   Final   ? ?ECHOCARDIOGRAM COMPLETE ? ?Result Date: 01/13/2022 ?   ECHOCARDIOGRAM REPORT   Patient Name:   Ivan ESCOE Sr. Date of Exam: 01/13/2022 Medical Rec #:  [id number]           Height:       68.0 in Accession #:    [id number]          Weight:       217.6 lb Date of Birth:  October 19, 1935           BSA:          2.118 m? Patient Age:    86 years            BP:           145/61 mmHg Patient Gender: M                   HR:           47 bpm. Exam Location:  [facility] Procedure: 2D Echo, Cardiac Doppler and Color Doppler Indications:     CHF-acute diastolic I50.31  History:         Patient has no prior history of Echocardiogram examinations.                  Stroke; Risk Factors:Diabetes and Hypertension.  Sonographer:     Cristela Blue Referring Phys:  [id number] [facility] MICHELLE TANG Diagnosing Phys: Arnoldo Hooker MD IMPRESSIONS  1. Left ventricular ejection fraction, by estimation, is 55 to  60%. The left ventricle has normal function. The left ventricle has no regional wall motion abnormalities. Left ventricular diastolic parameters were normal.  2. Right ventricular systolic function is normal. The right ventricular size is normal.  3. The mit

## 2022-01-14 NOTE — TOC Benefit Eligibility Note (Signed)
Patient Advocate Encounter ? ?Insurance verification completed.   ? ?The patient is currently admitted and upon discharge could be taking Eliquis 5 mg. ? ?The current 30 day co-pay is, $47.00.  ? ?The patient is insured through [employer]  ? ? ? ?Zadyn Yardley, CPhT ?Pharmacy Patient Advocate Specialist ?[facility] Patient Advocate Team ?Direct Number: [phone]  Fax: [phone] ? ? ? ? ? ?  ?

## 2022-01-14 NOTE — Discharge Instructions (Signed)
Advised to follow-up with primary care physician in 1 week. ?Advised to follow-up with cardiology as scheduled. ?Advised to take Eliquis 5 mg twice daily for prevention for stroke. ?Advised to hold metoprolol for bradycardia. ?Advised to continue Lasix 40 mg daily for CHF. ?

## 2022-01-14 NOTE — TOC CM/SW Note (Signed)
Patient has orders to discharge home today. Chart reviewed. PCP is Harrel Lemon, MD. On room air. No wounds. Discharging on new Eliquis. Provided 30-day free card. Also gave $10 copay card since benefits check was run with his [employer]. Family at bedside. No further concerns. CSW signing off. ? ?Dayton Scrape, [place] ?[phone] ? ?

## 2022-01-14 NOTE — Consult Note (Signed)
ANTICOAGULATION CONSULT NOTE ? ?Pharmacy Consult for IV heparin ?Indication: atrial fibrillation ? ?Allergies  ?Allergen Reactions  ? Clindamycin/Lincomycin Other (See Comments)  ?  unknown  ? Enalapril Maleate   ?  Other reaction(s): Cough  ? Erythromycin Other (See Comments)  ?  unknown  ? Nitrofurantoin   ?  Other reaction(s): Unknown  ? Sulfa Antibiotics Other (See Comments)  ?  unknown  ? Penicillins Rash  ? Phenobarbital Rash  ? Phenylbutazones Rash  ? ? ?Patient Measurements: ?Height: 5\' 8"  (172.7 cm) ?Weight: 95.7 kg (210 lb 15.7 oz) ?IBW/kg (Calculated) : 68.4 ?Heparin Dosing Weight: 100kg ? ?Vital Signs: ?Temp: 98.4 ?F (36.9 ?C) (03/16 0423) ?BP: 158/80 (03/16 0423) ?Pulse Rate: 65 (03/16 0423) ? ?Labs: ?Recent Labs  ?  01/12/22 ?1129 01/12/22 ?1634 01/12/22 ?2252 01/13/22 ?0414 01/13/22 ?0647 01/13/22 ?1629 01/13/22 ?1857 01/13/22 ?2134 01/14/22 ?0606  ?HGB 12.6*  --   --  10.9*  --   --   --   --  11.3*  ?HCT 39.1  --   --  34.0*  --   --   --   --  34.4*  ?PLT 252  --   --  226  --   --   --   --  240  ?APTT  --  35  --   --   --   --   --   --   --   ?HEPARINUNFRC  --   --  0.47  --  0.40  --   --   --  0.24*  ?CREATININE 1.06  --   --  1.06  --   --   --   --   --   ?TROPONINIHS 49* 48*  --   --   --  83* 80* 79*  --   ? ? ? ?Estimated Creatinine Clearance: 58.2 mL/min (by C-G formula based on SCr of 1.06 mg/dL). ? ? ?Medical History: ?Past Medical History:  ?Diagnosis Date  ? Diabetes mellitus without complication (HCC)   ? Hypertension   ? Stroke [facility])   ? ? ?Medications:  ?PTA: Clopidogrel  75mg  PO daily ?Inpatient: Heparin drip (3/14 >>>) ?Allergies: No AC/APT related allergies ? ?Assessment: ?87 y.o. male with medical history significant of HTN, stroke, GERD, AAA, sinus bradycardia, who presents with shortness of breath. Admitted with new onset Afib. Pharmacy consulted for management of heparin drip. Estimated CHADSVASC 6. ? ?Baseline Labs: Hgb, plts (252) stable, aPTT 35 ? ?CBC stable 3/15. Plts  226. ? ?Date Time aPTT/HL Rate/Comment ?3/14 2252 0.47  1400 units/hr ; therapeutic ?3/14  0647 0.40  1400 units/hr ; therapeutic  ? ?Goal of Therapy:  ?Heparin level 0.3-0.7 units/ml ?Monitor platelets by anticoagulation protocol: Yes ?  ?Plan:  ?3/16:  HL @ 0606 = 0.24, subtherapeutic  ?Will order Heparin 1500 units IV X 1 bolus and increase drip rate to 1600 units/hr. ?Will recheck HL 8 hrs after rate change.  ? ?Emit Kuenzel D, PharmD ?01/14/2022 ?6:47 AM ? ? ? ? ?

## 2022-01-14 NOTE — Discharge Summary (Addendum)
?Physician Discharge Summary ?  ?Patient: Ivan WADSWORTH Sr. MRN: [id number] DOB: 06-Dec-1934  ?Admit date:     01/12/2022  ?Discharge date: 01/14/22  ?Discharge Physician: Cipriano Bunker  ? ?PCP: Gracelyn Nurse, MD  ? ?Recommendations at discharge:  ?Advised to follow-up with primary care physician in 1 week. ?Advised to follow-up with Cardiology as scheduled. ?Advised to take Eliquis 5 mg twice daily for prevention for stroke. ?Advised to hold metoprolol for bradycardia. ?Advised to continue Lasix 40 mg daily for CHF. ? ?Discharge Diagnoses: ?Principal Problem: ?  New onset atrial fibrillation (HCC) ?Active Problems: ?  Acute CHF (HCC) ?  Essential hypertension ?  Acute respiratory failure with hypoxia (HCC) ?  AAA (abdominal aortic aneurysm) without rupture ?  GERD (gastroesophageal reflux disease) ?  Stroke [facility]) ?  Elevated troponin ?  Diabetes mellitus without complication_diet controled ?  Hypokalemia ? ?Resolved Problems: ?  * No resolved hospital problems. * ? ?Hospital Course: ?This 87 years old male with PMH significant for hypertension, type 2 diabetes, AAA, history of CVA in 1990, GERD presented in the ED with A-fib with slow ventricular rate.  Patient initially presented to [facility] clinic for shortness of breath and found to have atrial fibrillation with slow heart rate.  Patient reports worsening shortness of breath with exertion which is unusual for him. ?Patient was admitted for A-fib with slow ventricular rate. Cardiology was consulted. Patient also found to have CHF exacerbation given SOB on exertion, bilateral leg edema, weight gain and slightly elevated BNP.  Patient was started on IV Lasix.  Patient continues to get better.  He is euvolemic on discharge.  Patient is cleared from cardiology to be discharged on Eliquis, metoprolol was discontinued given bradycardia.  Patient will follow-up with cardiology outpatient. ? ?Assessment and Plan: ?* New onset atrial fibrillation (HCC) ?Patient reports  progressive shortness of breath on exertion, weight gain and bilateral leg edema. He is found to have A-fib with slow ventricular rate on EKG. ?Heart rate remains between 32-57.  Hemodynamically stable. ?Metoprolol discontinued.  Glucagon given.   ?CHA2DS2-VASc Score 7.  No indication for pacemaker at this time. ?Cardiology is consulted.  Continued IV heparin gtt. ?TSH normal. Transitioned on Eliquis. ?Echocardiogram shows LVEF 55 to 60%. ? ?Acute CHF (HCC) ?Patient denies any history of congestive heart failure. ?Likely CHF exacerbation given weight gain, SOB on exertion,  bilateral leg edema  JVD+ and crackles on exam, CXR: Possibly pulmonary edema consistent with acute CHF.  No 2D echo on record.  Given long history of HTN may have diastolic CHF. ?Continue Lasix 40 mg IV twice daily. ?Obtain 2D echocardiogram,  ?Daily weight,  intake output charting, low-salt diet. ? ? ?Intake/Output Summary (Last 24 hours) at 01/13/2022 1048 ?Last data filed at 01/13/2022 0900 ?Gross per 24 hour  ?Intake 481.15 ml  ?Output 2975 ml  ?Net -2493.85 ml  ? ? ? ?Essential hypertension ?Continue Cozaar, IV Lasix. ?Hold metoprolol due to bradycardia. ?Continue IV hydralazine as needed. ? ?Acute respiratory failure with hypoxia (HCC) ?Patient presented with hypoxia, SPO2 88% on room air which improved to 95% on 2 L, likely secondary to CHF. She is also found to have mild leukocytosis, denies any fever, procalcitonin normal.  Less likely pneumonia. ?Continue supplemental oxygen to maintain saturation above 93% and wean as tolerated. ?Continue as needed albuterol and Mucinex. ? ?Hypokalemia ?Replaced.  Continue to monitor ? ?Elevated troponin ?He is found to have elevated troponin likely due to demand ischemia in the setting of  CHF and A-fib. ?Troponin trending down 49> 48 less likely ACS. ?Continue aspirin, lipid profile normal. ?Continue IV heparin for atrial fibrillation. ? ? ?Stroke [facility](HCC) ?Continue aspirin and statins. ?Patient does not  have any residual deficits. ? ?GERD (gastroesophageal reflux disease) ?Continue pantoprazole. ? ?AAA (abdominal aortic aneurysm) without rupture ?Follow-up outpatient with Dr. Lorretta HarpSchneir ? ? ?Consultants: Cardiology ?Procedures performed: Echocardiogram ?Disposition: Home ?Diet recommendation:  ?Discharge Diet Orders (From admission, onward)  ? ?  Start     Ordered  ? 01/14/22 0000  Diet - low sodium heart healthy       ? 01/14/22 1014  ? 01/14/22 0000  Diet Carb Modified       ? 01/14/22 1014  ? ?  ?  ? ?  ? ?Carb modified diet ?DISCHARGE MEDICATION: ?Allergies as of 01/14/2022   ? ?   Reactions  ? Clindamycin/lincomycin Other (See Comments)  ? unknown  ? Enalapril Maleate   ? Other reaction(s): Cough  ? Erythromycin Other (See Comments)  ? unknown  ? Nitrofurantoin   ? Other reaction(s): Unknown  ? Sulfa Antibiotics Other (See Comments)  ? unknown  ? Penicillins Rash  ? Phenobarbital Rash  ? Phenylbutazones Rash  ? ?  ? ?  ?Medication List  ?  ? ?STOP taking these medications   ? ?hydrochlorothiazide 25 MG tablet ?Commonly known as: HYDRODIURIL ?  ?meloxicam 15 MG tablet ?Commonly known as: MOBIC ?  ?metoprolol tartrate 25 MG tablet ?Commonly known as: LOPRESSOR ?  ? ?  ? ?TAKE these medications   ? ?apixaban 5 MG Tabs tablet ?Commonly known as: ELIQUIS ?Take 1 tablet (5 mg total) by mouth 2 (two) times daily. ?  ?aspirin EC 81 MG tablet ?Take 81 mg by mouth daily. ?  ?Fish Oil 1000 MG Caps ?Take 1 capsule by mouth 2 (two) times daily. ?  ?furosemide 40 MG tablet ?Commonly known as: Lasix ?Take 1 tablet (40 mg total) by mouth daily. ?  ?hydrocortisone 2.5 % cream ?Apply 1 application. topically 2 (two) times daily as needed (itching). ?  ?losartan 100 MG tablet ?Commonly known as: COZAAR ?Take 100 mg by mouth daily. ?  ?omeprazole 40 MG capsule ?Commonly known as: PRILOSEC ?Take 40 mg by mouth daily. ?  ?vitamin B-12 1000 MCG tablet ?Commonly known as: CYANOCOBALAMIN ?Take 1,000 mcg by mouth daily. ?  ? ?  ? ?  Follow-up Information   ? ? Gracelyn NurseJohnston, John D, MD Follow up on 01/21/2022.   ?Specialty: Internal Medicine ?Why: @ 10:45am ?Contact information: ?[address] ?[place] [address] ?[phone] ? ? ?  ?  ? ? Lamar BlinksKowalski, Bruce J, MD Follow up on 02/02/2022.   ?Specialty: Cardiology ?Why: @4pm  ?Contact information: ?[address] ?[facility] ?[place] [address] ?[phone] ? ? ?  ?  ? ?  ?  ? ?  ? ?Discharge Exam: ?Filed Weights  ? 01/12/22 1400 01/13/22 0450 01/14/22 0500  ?Weight: 100 kg 98.7 kg 95.7 kg  ? ?General exam: Appears comfortable, not in any acute distress. ?Respiratory system: CTA bilaterally, no wheezing, no crackles, normal respiratory effort. ?Cardiovascular system: S1-S2 heard, irregular rhythm, no murmur. ?Gastrointestinal system: .  Abdomen is soft, nontender, nondistended, BS+ ?Central nervous system: Alert, oriented x3, no focal neurological deficits. ?Extremities: Edema+, no cyanosis, no clubbing. ?Psychiatry: Mood, insight, judgment normal ? ? ?Condition at discharge: stable ? ?The results of significant diagnostics from this hospitalization (including imaging, microbiology, ancillary and laboratory) are listed below for reference.  ? ?  Imaging Studies: ?DG Chest Port 1 View ? ?Result Date: 01/12/2022 ?CLINICAL DATA:  Shortness of breath. EXAM: PORTABLE CHEST 1 VIEW COMPARISON:  Two-view chest x-ray 08/17/2015 FINDINGS: Heart is enlarged. Atherosclerotic changes are present at the aortic arch. Patchy interstitial and airspace opacities are present bilaterally. No significant consolidation is present. No significant effusion or pneumothorax is present. Degenerative changes are noted in the shoulders. IMPRESSION: 1. Patchy interstitial and airspace opacities bilaterally. This may represent edema or infection. Viral pneumonia can demonstrate this pattern. 2. Cardiomegaly without failure. 3. Aortic atherosclerosis. Electronically Signed   By: Marin Roberts  M.D.   On: 01/12/2022 12:05  ? ?VAS [place] AAA DUPLEX ? ?Result Date: 12/28/2021 ?ABDOMINAL AORTA STUDY Patient Name:  Ivan TOWSON Sr.  Date of Exam:   12/28/2021 Medical Rec #: [id number]            Accession

## 2022-01-19 ENCOUNTER — Telehealth (INDEPENDENT_AMBULATORY_CARE_PROVIDER_SITE_OTHER): Payer: Self-pay

## 2022-01-19 NOTE — Progress Notes (Signed)
? Patient ID: Ivan Hering Sr., male    DOB: June 24, 1935, 87 y.o.   MRN: [id number] ? ?HPI ? ?Mr Ivan Lewis is a 87 y/o male with a history of atrial fibrillation, DM, HTN, stroke and chronic heart failure.  ? ?Echo report from 01/13/22 reviewed and showed an EF of 55-60% along with mild MR.  ? ?Admitted 01/12/22 due to shortness of breath and found to have atrial fibrillation with slow heart rate. Cardiology consult obtained. Initially given IV lasix with transition to oral diuretics. Metoprolol stopped due to bradycardia. Hypokalemia corrected. Elevated troponin thought to be due to demand ischemia. Discharged after 2 days.  ? ?He presents today for his initial visit with a chief complaint of right hip/ leg pain. Describes this as chronic although does seem to be worsening where it's interfering with his ability to sleep well. He has no other symptoms and specifically denies any dizziness, abdominal distention, palpitations, pedal edema, chest pain, shortness of breath, cough, fatigue or weight gain.  ? ?Weighing himself daily. Has not been adding any salt to his food since his recent admission.  ? ?Past Medical History:  ?Diagnosis Date  ? AAA (abdominal aortic aneurysm)   ? Arrhythmia   ? atrial fibrillation  ? B12 deficiency   ? CHF (congestive heart failure) (HCC)   ? Diabetes mellitus without complication (HCC)   ? Diverticulosis   ? Hypertension   ? Spinal stenosis   ? Stroke [facility])   ? ?Past Surgical History:  ?Procedure Laterality Date  ? CHOLECYSTECTOMY    ? ?Family History  ?Problem Relation Age of Onset  ? Cancer Mother   ? Heart attack Father   ? Cancer Brother   ? Heart attack Brother   ? Heart Problems Son   ? ?Social History  ? ?Tobacco Use  ? Smoking status: Former  ? Smokeless tobacco: Never  ?Substance Use Topics  ? Alcohol use: Yes  ? ?Allergies  ?Allergen Reactions  ? Clindamycin/Lincomycin Other (See Comments)  ?  unknown  ? Enalapril Maleate   ?  Other reaction(s): Cough  ? Erythromycin Other (See  Comments)  ?  unknown  ? Nitrofurantoin   ?  Other reaction(s): Unknown  ? Sulfa Antibiotics Other (See Comments)  ?  unknown  ? Penicillins Rash  ? Phenobarbital Rash  ? Phenylbutazones Rash  ? ?Prior to Admission medications   ?Medication Sig Start Date End Date Taking? Authorizing Provider  ?apixaban (ELIQUIS) 5 MG TABS tablet Take 1 tablet (5 mg total) by mouth 2 (two) times daily. 01/14/22  Yes Cipriano Bunker, MD  ?aspirin EC 81 MG tablet Take 81 mg by mouth daily.   Yes [provider]  ?furosemide (LASIX) 40 MG tablet Take 1 tablet (40 mg total) by mouth daily. 01/14/22 01/14/23 Yes Cipriano Bunker, MD  ?hydrocortisone 2.5 % cream Apply 1 application. topically 2 (two) times daily as needed (itching).   Yes [provider]  ?losartan (COZAAR) 100 MG tablet Take 100 mg by mouth daily.   Yes [provider]  ?Omega-3 Fatty Acids (FISH OIL) 1000 MG CAPS Take 1 capsule by mouth 2 (two) times daily.   Yes [provider]  ?omeprazole (PRILOSEC) 40 MG capsule Take 40 mg by mouth daily.   Yes [provider]  ?vitamin B-12 (CYANOCOBALAMIN) 1000 MCG tablet Take 1,000 mcg by mouth daily.   Yes [provider]  ? ? ?Review of Systems  ?Constitutional:  Negative for appetite change and fatigue.  ?  HENT:  Negative for congestion, rhinorrhea and sore throat.   ?Eyes: Negative.   ?Respiratory:  Negative for chest tightness and shortness of breath.   ?Cardiovascular:  Negative for chest pain, palpitations and leg swelling.  ?Gastrointestinal:  Negative for abdominal distention and abdominal pain.  ?Endocrine: Negative.   ?Genitourinary: Negative.   ?Musculoskeletal:  Positive for arthralgias (right hip/leg pain). Negative for back pain.  ?Skin: Negative.   ?Allergic/Immunologic: Negative.   ?Neurological:  Negative for dizziness and light-headedness.  ?Hematological:  Negative for adenopathy. Does not bruise/bleed easily.  ?Psychiatric/Behavioral:  Positive for sleep  disturbance (difficulty sleeping due to right leg pain). Negative for dysphoric mood. The patient is not nervous/anxious.   ? ?Vitals:  ? 01/20/22 1257  ?BP: (!) 165/92  ?Pulse: (!) 59  ?Resp: 18  ?SpO2: 97%  ?Weight: 211 lb 6 oz (95.9 kg)  ?Height: 5\' 8"  (1.727 m)  ? ?Wt Readings from Last 3 Encounters:  ?01/20/22 211 lb 6 oz (95.9 kg)  ?01/14/22 210 lb 15.7 oz (95.7 kg)  ?12/28/21 209 lb 6.4 oz (95 kg)  ? ?Lab Results  ?Component Value Date  ? CREATININE 1.02 01/14/2022  ? CREATININE 1.06 01/13/2022  ? CREATININE 1.06 01/12/2022  ? ?Physical Exam ?Vitals and nursing note reviewed.  ?Constitutional:   ?   Appearance: Normal appearance.  ?HENT:  ?   Head: Normocephalic and atraumatic.  ?Cardiovascular:  ?   Rate and Rhythm: Bradycardia present. Rhythm irregular.  ?Pulmonary:  ?   Effort: Pulmonary effort is normal. No respiratory distress.  ?   Breath sounds: No wheezing or rales.  ?Abdominal:  ?   General: There is no distension.  ?   Palpations: Abdomen is soft.  ?   Tenderness: There is no abdominal tenderness.  ?Musculoskeletal:     ?   General: No tenderness.  ?   Cervical back: Normal range of motion and neck supple.  ?   Right lower leg: Edema (trace pitting) present.  ?   Left lower leg: Edema (trace pitting) present.  ?Skin: ?   General: Skin is warm and dry.  ?Neurological:  ?   General: No focal deficit present.  ?   Mental Status: He is alert and oriented to person, place, and time.  ?Psychiatric:     ?   Mood and Affect: Mood normal.     ?   Behavior: Behavior normal.     ?   Thought Content: Thought content normal.  ? ? ?Assessment & Plan: ? ?1: Chronic heart failure with preserved ejection fraction without structural changes- ?- NYHA class I ?- euvolemic today ?- weighing daily; understands to call for an overnight weight gain of > 2 pounds or a weekly weight gain of > 5 pounds ?- not adding salt and hasn't been since his recent admission; understands to read food labels for sodium content to keep his  daily intake to 2000mg  / day ?- instructed to get compression socks and put them on every morning with removal at bedtime ?- BNP 01/12/22 was 351.5 ? ?2: HTN- ?- BP elevated (165/92) ?- saw PCP Letitia Libra(Johnston) 01/07/22; returns tomorrow with probable lab work ?- BMP 01/14/22 reviewed and showed sodium 136, potassium 3.2, creatinine 1.02 & GFR >60 ? ?3: DM- ?- A1c 01/12/22 was 7.2% ?- diet controlled; does not check glucose ? ?4: Atrial fibrillation- ?- to see cardiology Gwen Pounds(Kowalski) 02/02/22 ?- bradycardic but irregular ? ? ?Patient did not bring his medications.  ? ?Patient prefers to f/u  here PRN. He will closely with PCP and cardiology. Advised him to call us anytime for any questions or problems and he was comfortable with this plan.  ?

## 2022-01-19 NOTE — Telephone Encounter (Signed)
Patient called with questions regarding his laser procedure and having two insurances and being billed for the procedure. The call was transferred to [facility] to speak with the patient. The call was dropped and I called the patient back to get him with Kenney Houseman to help. ?

## 2022-01-19 NOTE — Telephone Encounter (Signed)
Pt returned call to Vernona Rieger and call was transferred to me. Pt states that his visit on 2.27.23 has an outstanding bill and it is much more than he normally pays with Medicare and BCBS Supplement. He is questioning whether or not BCBS was run for these service date. I explained that we can only see so much at this level and he would need to call main billing number. He states that he ended up LVM with billing and hoping they would call him back. Nothing further from this office needed.  ?

## 2022-01-20 ENCOUNTER — Ambulatory Visit: Payer: Medicare Other | Attending: Family | Admitting: Family

## 2022-01-20 ENCOUNTER — Encounter: Payer: Self-pay | Admitting: Family

## 2022-01-20 ENCOUNTER — Other Ambulatory Visit: Payer: Self-pay

## 2022-01-20 VITALS — BP 165/92 | HR 59 | Resp 18 | Ht 68.0 in | Wt 211.4 lb

## 2022-01-20 DIAGNOSIS — Z8673 Personal history of transient ischemic attack (TIA), and cerebral infarction without residual deficits: Secondary | ICD-10-CM | POA: Diagnosis not present

## 2022-01-20 DIAGNOSIS — I110 Hypertensive heart disease with heart failure: Secondary | ICD-10-CM | POA: Diagnosis present

## 2022-01-20 DIAGNOSIS — I340 Nonrheumatic mitral (valve) insufficiency: Secondary | ICD-10-CM | POA: Diagnosis not present

## 2022-01-20 DIAGNOSIS — M79604 Pain in right leg: Secondary | ICD-10-CM | POA: Diagnosis not present

## 2022-01-20 DIAGNOSIS — I5032 Chronic diastolic (congestive) heart failure: Secondary | ICD-10-CM | POA: Insufficient documentation

## 2022-01-20 DIAGNOSIS — M25551 Pain in right hip: Secondary | ICD-10-CM | POA: Insufficient documentation

## 2022-01-20 DIAGNOSIS — E119 Type 2 diabetes mellitus without complications: Secondary | ICD-10-CM | POA: Diagnosis not present

## 2022-01-20 DIAGNOSIS — I10 Essential (primary) hypertension: Secondary | ICD-10-CM | POA: Diagnosis not present

## 2022-01-20 DIAGNOSIS — I4891 Unspecified atrial fibrillation: Secondary | ICD-10-CM | POA: Insufficient documentation

## 2022-01-20 DIAGNOSIS — I480 Paroxysmal atrial fibrillation: Secondary | ICD-10-CM | POA: Diagnosis not present

## 2022-01-20 NOTE — Patient Instructions (Addendum)
Continue weighing daily and call for an overnight weight gain of 3 pounds or more or a weekly weight gain of more than 5 pounds. ? ? ?If you have voicemail, please make sure your mailbox is cleaned out so that we may leave a message and please make sure to listen to any voicemails.  ? ? ?Call us in the future if you need us for anything ?

## 2023-10-10 ENCOUNTER — Inpatient Hospital Stay
Admission: EM | Admit: 2023-10-10 | Discharge: 2023-10-13 | DRG: 603 | Disposition: A | Discharge status: Home / Self Care | Payer: Medicare PPO | Attending: Internal Medicine | Admitting: Internal Medicine

## 2023-10-10 ENCOUNTER — Emergency Department: Payer: Medicare PPO

## 2023-10-10 ENCOUNTER — Other Ambulatory Visit: Payer: Self-pay

## 2023-10-10 ENCOUNTER — Inpatient Hospital Stay: Payer: Medicare PPO

## 2023-10-10 DIAGNOSIS — M19042 Primary osteoarthritis, left hand: Secondary | ICD-10-CM | POA: Diagnosis present

## 2023-10-10 DIAGNOSIS — M19041 Primary osteoarthritis, right hand: Secondary | ICD-10-CM | POA: Diagnosis present

## 2023-10-10 DIAGNOSIS — E538 Deficiency of other specified B group vitamins: Secondary | ICD-10-CM | POA: Diagnosis present

## 2023-10-10 DIAGNOSIS — S50312A Abrasion of left elbow, initial encounter: Secondary | ICD-10-CM | POA: Diagnosis present

## 2023-10-10 DIAGNOSIS — Z882 Allergy status to sulfonamides status: Secondary | ICD-10-CM

## 2023-10-10 DIAGNOSIS — Z87891 Personal history of nicotine dependence: Secondary | ICD-10-CM | POA: Diagnosis not present

## 2023-10-10 DIAGNOSIS — Z881 Allergy status to other antibiotic agents status: Secondary | ICD-10-CM | POA: Diagnosis not present

## 2023-10-10 DIAGNOSIS — Z7982 Long term (current) use of aspirin: Secondary | ICD-10-CM | POA: Diagnosis not present

## 2023-10-10 DIAGNOSIS — Z7901 Long term (current) use of anticoagulants: Secondary | ICD-10-CM | POA: Diagnosis not present

## 2023-10-10 DIAGNOSIS — S60512A Abrasion of left hand, initial encounter: Secondary | ICD-10-CM | POA: Diagnosis present

## 2023-10-10 DIAGNOSIS — Z809 Family history of malignant neoplasm, unspecified: Secondary | ICD-10-CM

## 2023-10-10 DIAGNOSIS — I4891 Unspecified atrial fibrillation: Secondary | ICD-10-CM

## 2023-10-10 DIAGNOSIS — I5030 Unspecified diastolic (congestive) heart failure: Secondary | ICD-10-CM | POA: Diagnosis not present

## 2023-10-10 DIAGNOSIS — I5032 Chronic diastolic (congestive) heart failure: Secondary | ICD-10-CM | POA: Diagnosis present

## 2023-10-10 DIAGNOSIS — Z8673 Personal history of transient ischemic attack (TIA), and cerebral infarction without residual deficits: Secondary | ICD-10-CM

## 2023-10-10 DIAGNOSIS — I639 Cerebral infarction, unspecified: Secondary | ICD-10-CM | POA: Diagnosis present

## 2023-10-10 DIAGNOSIS — I482 Chronic atrial fibrillation, unspecified: Secondary | ICD-10-CM | POA: Diagnosis present

## 2023-10-10 DIAGNOSIS — L03114 Cellulitis of left upper limb: Secondary | ICD-10-CM | POA: Diagnosis present

## 2023-10-10 DIAGNOSIS — Z888 Allergy status to other drugs, medicaments and biological substances status: Secondary | ICD-10-CM

## 2023-10-10 DIAGNOSIS — W19XXXA Unspecified fall, initial encounter: Secondary | ICD-10-CM | POA: Diagnosis present

## 2023-10-10 DIAGNOSIS — E1169 Type 2 diabetes mellitus with other specified complication: Secondary | ICD-10-CM | POA: Diagnosis not present

## 2023-10-10 DIAGNOSIS — K219 Gastro-esophageal reflux disease without esophagitis: Secondary | ICD-10-CM | POA: Diagnosis present

## 2023-10-10 DIAGNOSIS — I110 Hypertensive heart disease with heart failure: Secondary | ICD-10-CM | POA: Diagnosis present

## 2023-10-10 DIAGNOSIS — Z79899 Other long term (current) drug therapy: Secondary | ICD-10-CM | POA: Diagnosis not present

## 2023-10-10 DIAGNOSIS — T148XXA Other injury of unspecified body region, initial encounter: Secondary | ICD-10-CM

## 2023-10-10 DIAGNOSIS — Z9049 Acquired absence of other specified parts of digestive tract: Secondary | ICD-10-CM | POA: Diagnosis not present

## 2023-10-10 DIAGNOSIS — I714 Abdominal aortic aneurysm, without rupture, unspecified: Secondary | ICD-10-CM | POA: Diagnosis present

## 2023-10-10 DIAGNOSIS — E119 Type 2 diabetes mellitus without complications: Secondary | ICD-10-CM

## 2023-10-10 DIAGNOSIS — Z880 Allergy status to penicillin: Secondary | ICD-10-CM | POA: Diagnosis not present

## 2023-10-10 DIAGNOSIS — S60222A Contusion of left hand, initial encounter: Secondary | ICD-10-CM | POA: Diagnosis present

## 2023-10-10 DIAGNOSIS — L089 Local infection of the skin and subcutaneous tissue, unspecified: Secondary | ICD-10-CM | POA: Diagnosis not present

## 2023-10-10 DIAGNOSIS — Z8249 Family history of ischemic heart disease and other diseases of the circulatory system: Secondary | ICD-10-CM | POA: Diagnosis not present

## 2023-10-10 DIAGNOSIS — I10 Essential (primary) hypertension: Secondary | ICD-10-CM | POA: Diagnosis present

## 2023-10-10 LAB — BASIC METABOLIC PANEL
Anion gap: 14 (ref 5–15)
BUN: 22 mg/dL (ref 8–23)
CO2: 20 mmol/L — ABNORMAL LOW (ref 22–32)
Calcium: 8.8 mg/dL — ABNORMAL LOW (ref 8.9–10.3)
Chloride: 101 mmol/L (ref 98–111)
Creatinine, Ser: 1.08 mg/dL (ref 0.61–1.24)
GFR, Estimated: >60 mL/min (ref >60)
Glucose, Bld: 136 mg/dL — ABNORMAL HIGH (ref 70–99)
Potassium: 3.7 mmol/L (ref 3.5–5.1)
Sodium: 135 mmol/L (ref 135–145)

## 2023-10-10 LAB — CBC
HCT: 34.4 % — ABNORMAL LOW (ref 39.0–52.0)
Hemoglobin: 11.1 g/dL — ABNORMAL LOW (ref 13.0–17.0)
MCH: 28.1 pg (ref 26.0–34.0)
MCHC: 32.3 g/dL (ref 30.0–36.0)
MCV: 87.1 fL (ref 80.0–100.0)
Platelets: 311 10*3/uL (ref 150–400)
RBC: 3.95 MIL/uL — ABNORMAL LOW (ref 4.22–5.81)
RDW: 13.2 % (ref 11.5–15.5)
WBC: 10.9 10*3/uL — ABNORMAL HIGH (ref 4.0–10.5)
nRBC: 0 % (ref 0.0–0.2)

## 2023-10-10 LAB — CBG MONITORING, ED
Glucose-Capillary: 150 mg/dL — ABNORMAL HIGH (ref 70–99)
Glucose-Capillary: 116 mg/dL — ABNORMAL HIGH (ref 70–99)

## 2023-10-10 LAB — LACTIC ACID, PLASMA
Lactic Acid, Venous: 0.9 mmol/L (ref 0.5–1.9)
Lactic Acid, Venous: 1 mmol/L (ref 0.5–1.9)

## 2023-10-10 LAB — HEMOGLOBIN A1C
Hgb A1c MFr Bld: 6.4 % — ABNORMAL HIGH (ref 4.8–5.6)
Mean Plasma Glucose: 136.98 mg/dL

## 2023-10-10 MED ORDER — PANTOPRAZOLE SODIUM 40 MG PO TBEC
40.0000 mg | DELAYED_RELEASE_TABLET | Freq: Every day | ORAL | Status: DC
  Administered 2023-10-10 – 2023-10-13 (×4): 40 mg via ORAL
  Filled 2023-10-10 (×4): qty 1

## 2023-10-10 MED ORDER — LORAZEPAM 2 MG/ML IJ SOLN
0.5000 mg | Freq: Once | INTRAMUSCULAR | Status: AC
  Administered 2023-10-10: 0.5 mg via INTRAVENOUS
  Filled 2023-10-10: qty 1

## 2023-10-10 MED ORDER — VANCOMYCIN HCL IN DEXTROSE 1-5 GM/200ML-% IV SOLN
1000.0000 mg | Freq: Once | INTRAVENOUS | Status: AC
  Administered 2023-10-10: 1000 mg via INTRAVENOUS
  Filled 2023-10-10: qty 200

## 2023-10-10 MED ORDER — APIXABAN 5 MG PO TABS
5.0000 mg | ORAL_TABLET | Freq: Two times a day (BID) | ORAL | Status: DC
  Administered 2023-10-11 – 2023-10-13 (×6): 5 mg via ORAL
  Filled 2023-10-10 (×6): qty 1

## 2023-10-10 MED ORDER — HYDROCODONE-ACETAMINOPHEN 5-325 MG PO TABS
1.0000 | ORAL_TABLET | Freq: Four times a day (QID) | ORAL | Status: DC | PRN
  Administered 2023-10-11: 1 via ORAL
  Filled 2023-10-10: qty 1

## 2023-10-10 MED ORDER — FENTANYL CITRATE PF 50 MCG/ML IJ SOSY
50.0000 ug | PREFILLED_SYRINGE | Freq: Once | INTRAMUSCULAR | Status: AC
Start: 2023-10-10 — End: 2023-10-10
  Administered 2023-10-10: 50 ug via INTRAVENOUS
  Filled 2023-10-10: qty 1

## 2023-10-10 MED ORDER — VANCOMYCIN HCL 1750 MG/350ML IV SOLN
1750.0000 mg | Freq: Once | INTRAVENOUS | Status: DC
  Filled 2023-10-10: qty 350

## 2023-10-10 MED ORDER — SODIUM CHLORIDE 0.9 % IV SOLN
2.0000 g | Freq: Two times a day (BID) | INTRAVENOUS | Status: DC
  Administered 2023-10-10: 2 g via INTRAVENOUS
  Filled 2023-10-10: qty 12.5

## 2023-10-10 MED ORDER — HYDROCORTISONE (PERIANAL) 2.5 % EX CREA
TOPICAL_CREAM | Freq: Two times a day (BID) | CUTANEOUS | Status: DC
  Filled 2023-10-10: qty 28.35

## 2023-10-10 MED ORDER — SODIUM CHLORIDE 0.9 % IV SOLN
2.0000 g | Freq: Once | INTRAVENOUS | Status: AC
  Administered 2023-10-10: 2 g via INTRAVENOUS
  Filled 2023-10-10: qty 12.5

## 2023-10-10 MED ORDER — ONDANSETRON HCL 4 MG PO TABS: 4.0000 mg | ORAL_TABLET | Freq: Four times a day (QID) | ORAL | Status: DC | PRN

## 2023-10-10 MED ORDER — SODIUM CHLORIDE 0.9 % IV SOLN: INTRAVENOUS | Status: DC

## 2023-10-10 MED ORDER — VANCOMYCIN HCL IN DEXTROSE 1-5 GM/200ML-% IV SOLN
1000.0000 mg | INTRAVENOUS | Status: DC
  Administered 2023-10-11 – 2023-10-12 (×2): 1000 mg via INTRAVENOUS
  Filled 2023-10-10 (×3): qty 200

## 2023-10-10 MED ORDER — VANCOMYCIN HCL 750 MG/150ML IV SOLN
750.0000 mg | Freq: Once | INTRAVENOUS | Status: AC
  Administered 2023-10-10: 750 mg via INTRAVENOUS
  Filled 2023-10-10: qty 150

## 2023-10-10 MED ORDER — ONDANSETRON HCL 4 MG/2ML IJ SOLN: 4.0000 mg | Freq: Four times a day (QID) | INTRAMUSCULAR | Status: DC | PRN

## 2023-10-10 MED ORDER — APIXABAN 5 MG PO TABS: 5.0000 mg | ORAL_TABLET | Freq: Two times a day (BID) | ORAL | Status: DC

## 2023-10-10 MED ORDER — ASPIRIN 81 MG PO TBEC
81.0000 mg | DELAYED_RELEASE_TABLET | Freq: Every day | ORAL | Status: DC
  Administered 2023-10-10 – 2023-10-13 (×4): 81 mg via ORAL
  Filled 2023-10-10 (×4): qty 1

## 2023-10-10 MED ORDER — INSULIN ASPART 100 UNIT/ML IJ SOLN
0.0000 [IU] | Freq: Three times a day (TID) | INTRAMUSCULAR | Status: DC
  Administered 2023-10-11 – 2023-10-12 (×2): 1 [IU] via SUBCUTANEOUS
  Filled 2023-10-10 (×2): qty 1

## 2023-10-10 NOTE — Consult Note (Signed)
Pharmacy Antibiotic Note  ASSESSMENT: 88 y.o. male with PMH Afib (on Eliquis), CVA, GERD, CHF, DM  is presenting with cellulitis. Approximately one week ago, patient fell and scratched his left hand and elbow. Since then, the arrow has become swollen, warm to touch, and patient was seen at urgent care clinic who prescribed doxycycline on 12/6. Area continued to worsen, prompting patient to come to ED. Patient currently afebrile and VSS but with mild leukocytosis. Pharmacy has been consulted to manage cefepime and vancomycin dosing. Patient has already received one-time doses of cefepime and vancomycin in the ED.  Patient measurements: Height: 5\' 5"  (165.1 cm) Weight: 77.1 kg (170 lb) IBW/kg (Calculated) : 61.5  Vital signs: Temp: 98 F (36.7 C) (12/09 0917) BP: 189/76 (12/09 0917) Pulse Rate: 61 (12/09 0917) Recent Labs  Lab 10/10/23 0912  WBC 10.9*  CREATININE 1.08   Estimated Creatinine Clearance: 47 mL/min (by C-G formula based on SCr of 1.08 mg/dL).  Allergies: Allergies  Allergen Reactions   Clindamycin/Lincomycin Other (See Comments)    unknown   Enalapril Maleate     Other reaction(s): Cough   Erythromycin Other (See Comments)    unknown   Nitrofurantoin     Other reaction(s): Unknown   Sulfa Antibiotics Other (See Comments)    unknown   Penicillins Rash   Phenobarbital Rash   Phenylbutazones Rash    Antimicrobials this admission: Cefepime 12/9 >> Vancomycin 12/9 >>  Dose adjustments this admission: N/A  Microbiology results: N/A  PLAN: Initiate cefepime 2 g IV q12H Administer vancomycin 1750 mg IV x 1 as a loading dose followed by vancomycin 1000 mg IV q24H eAUC 452, Cmax 29, Cmin 12 Scr 1.08, IBW, Vd 0.72 L/kg Follow up culture results to assess for antibiotic optimization. Monitor renal function to assess for any necessary antibiotic dosing changes.   Thank you for allowing pharmacy to be a part of this patient's care.  Will M. Dareen Piano,  PharmD Clinical Pharmacist 10/10/2023 2:42 PM

## 2023-10-10 NOTE — Consult Note (Signed)
PHARMACY - BRIEF ANTIBIOTIC NOTE   Pharmacy has received consult(s) for vancomycin from an ED provider. The patient's profile has been reviewed for ht/wt/allergies/indication/available labs.    One time order(s) placed for: vancomycin 1750 mg IV  Further antibiotics/pharmacy consults should be ordered by admitting physician if indicated.                       Thank you,  Will M. Dareen Piano, PharmD Clinical Pharmacist 10/10/2023 11:59 AM

## 2023-10-10 NOTE — ED Provider Notes (Signed)
[facility] Provider Note    Event Date/Time   First MD Initiated Contact with Patient 10/10/23 1043     (approximate)   History   Wound Infection   HPI  Ivan KLUTZ Sr. is a 88 y.o. male   Past medical history of diabetes and hypertension who presents to Emergency Department with worsening infection to the left hand.  Larey Seat and sustained a small abrasion to the dorsum of the left hand as well as the elbow about 1 week ago and over the last week it has increasing redness swelling and warmth, went to a clinic and was started on doxycycline approximately 4 days ago and has been taking however the infection has spread and become worsening.  He has no systemic signs of infection like fevers or chills.  He has pain with ranging the wrist and the digits.  Independent Historian contributed to assessment above: His son is at bedside to corroborate information past medical history as above    Physical Exam   Triage Vital Signs: ED Triage Vitals  Encounter Vitals Group     BP 10/10/23 0917 (!) 189/76     Systolic BP Percentile --      Diastolic BP Percentile --      Pulse Rate 10/10/23 0917 61     Resp 10/10/23 0917 18     Temp 10/10/23 0917 98 F (36.7 C)     Temp src --      SpO2 10/10/23 0917 100 %     Weight 10/10/23 0915 170 lb (77.1 kg)     Height 10/10/23 0915 5\' 5"  (1.651 m)     Head Circumference --      Peak Flow --      Pain Score 10/10/23 0915 10     Pain Loc --      Pain Education --      Exclude from Growth Chart --     Most recent vital signs: Vitals:   10/10/23 0917  BP: (!) 189/76  Pulse: 61  Resp: 18  Temp: 98 F (36.7 C)  SpO2: 100%    General: Awake, no distress.  CV:  Good peripheral perfusion.  Resp:  Normal effort.  Abd:  No distention.  Other:  Awake alert comfortable pleasant gentleman no acute distress with hypertension otherwise vital signs are normal.  He has significant swelling pain erythema to the dorsum of  the left hand diffusely.  He is able to gingerly range of the wrist and all digits though this causes pain.  There is no obvious crepitus or pain out of proportion.   ED Results / Procedures / Treatments   Labs (all labs ordered are listed, but only abnormal results are displayed) Labs Reviewed  CBC - Abnormal; Notable for the following components:      Result Value   WBC 10.9 (*)    RBC 3.95 (*)    Hemoglobin 11.1 (*)    HCT 34.4 (*)    All other components within normal limits  BASIC METABOLIC PANEL - Abnormal; Notable for the following components:   CO2 20 (*)    Glucose, Bld 136 (*)    Calcium 8.8 (*)    All other components within normal limits  LACTIC ACID, PLASMA  LACTIC ACID, PLASMA     I ordered and reviewed the above labs they are notable for slightly elevated WBC 10.9.   RADIOLOGY I independently reviewed and interpreted history of the hand see soft  tissue swelling but no obvious free air or bony changes I also reviewed radiologist's formal read.   PROCEDURES:  Critical Care performed: No  Procedures   MEDICATIONS ORDERED IN ED: Medications  vancomycin (VANCOCIN) IVPB 1000 mg/200 mL premix ( Intravenous Infusion Verify 10/10/23 1332)    Followed by  vancomycin (VANCOREADY) IVPB 750 mg/150 mL (has no administration in time range)  LORazepam (ATIVAN) injection 0.5 mg (has no administration in time range)  fentaNYL (SUBLIMAZE) injection 50 mcg (50 mcg Intravenous Given 10/10/23 1113)  ceFEPIme (MAXIPIME) 2 g in sodium chloride 0.9 % 100 mL IVPB (0 g Intravenous Stopped 10/10/23 1227)    External physician / consultants:  I spoke with Rosann Auerbach of orthopedics regarding care plan for this patient.   IMPRESSION / MDM / ASSESSMENT AND PLAN / ED COURSE  I reviewed the triage vital signs and the nursing notes.                                Patient's presentation is most consistent with acute presentation with potential threat to life or bodily  function.  Differential diagnosis includes, but is not limited to, skin infection, tendon sheath infection, osteomyelitis, sepsis   The patient is on the cardiac monitor to evaluate for evidence of arrhythmia and/or significant heart rate changes.  MDM:    This patient with worsening hand infection after a wound sustained 1 week ago failed outpatient antibiotics but no signs of sepsis no systemic signs of illness.  Given the degree of swelling and pain with ranging I obtained MRI of the hand to assess for deeper space infection or osteomyelitis.  Started on Vanco and cefepime.  Pain control with IV fentanyl.  I remove the ring on his finger as the swelling is starting to come up the finger and I gave the ring to his son.  Admit        FINAL CLINICAL IMPRESSION(S) / ED DIAGNOSES   Final diagnoses:  Wound infection     Rx / DC Orders   ED Discharge Orders     None        Note:  This document was prepared using Dragon voice recognition software and may include unintentional dictation errors.    Pilar Jarvis, MD 10/10/23 [id number]

## 2023-10-10 NOTE — ED Notes (Signed)
Patient transported to MRI 

## 2023-10-10 NOTE — Assessment & Plan Note (Signed)
Continue aspirin and statin. 

## 2023-10-10 NOTE — ED Triage Notes (Signed)
Pt comes with possible infection to left hand. Pt states he had fall few days ago and went to [facility]. Pt states he went back bc it got worse. Pt sent over here from there. Pt has bandage in place with obvious redness ,warmth and swelling. Pt also states thumb hurts and sharp pain down it.

## 2023-10-10 NOTE — Assessment & Plan Note (Signed)
2D echo March 2023 EF of 55 to 60% Euvolemic Monitor

## 2023-10-10 NOTE — Assessment & Plan Note (Signed)
Blood sugar in 130s  SSI  Monitor

## 2023-10-10 NOTE — H&P (Signed)
History and Physical    Patient: Ivan Lewis WJX:[id number] DOB: [birth year] DOA: 10/10/2023 DOS: the patient was seen and examined on 10/10/2023 PCP: Gracelyn Nurse, MD  Patient coming from: Home  Chief Complaint:  Chief Complaint  Patient presents with   Wound Infection   HPI: Ivan GUERNSEY Sr. is a 88 y.o. male with medical history significant of AAA, atrial fibrillation, HFpEF, type 2 diabetes, hypertension, history of CVA presenting with left hand wound infection.  Patient reports falling on left hand roughly 1 week ago.  Has had worsening redness and swelling.  No fevers or chills.  Mild serosanguineous drainage.  Also with abrasions over left elbow.  Minimal to mild swelling.  No chest pain or shortness of breath.  No palpitations.  No reported alcohol or tobacco use. Presented to the ER afebrile, hemodynamically stable.  White count 11, hemoglobin 9.1, platelets 311, creatinine 1.1.  Glucose 136.  Lactate within normal limits.  Left hand plain films with soft tissue swelling of the hand index and middle fingers. Review of Systems: As mentioned in the history of present illness. All other systems reviewed and are negative. Past Medical History:  Diagnosis Date   AAA (abdominal aortic aneurysm) (HCC)    Arrhythmia    atrial fibrillation   B12 deficiency    CHF (congestive heart failure) (HCC)    Diabetes mellitus without complication (HCC)    Diverticulosis    Hypertension    Spinal stenosis    Stroke [facility])    Past Surgical History:  Procedure Laterality Date   CHOLECYSTECTOMY     Social History:  reports that he has quit smoking. He has never used smokeless tobacco. He reports current alcohol use. He reports that he does not use drugs.  Allergies  Allergen Reactions   Clindamycin/Lincomycin Other (See Comments)    unknown   Enalapril Maleate     Other reaction(s): Cough   Erythromycin Other (See Comments)    unknown   Nitrofurantoin     Other reaction(s):  Unknown   Sulfa Antibiotics Other (See Comments)    unknown   Penicillins Rash   Phenobarbital Rash   Phenylbutazones Rash    Family History  Problem Relation Age of Onset   Cancer Mother    Heart attack Father    Cancer Brother    Heart attack Brother    Heart Problems Son     Prior to Admission medications   Medication Sig Start Date End Date Taking? Authorizing Provider  apixaban (ELIQUIS) 5 MG TABS tablet Take 1 tablet (5 mg total) by mouth 2 (two) times daily. 01/14/22   Willeen Niece, MD  aspirin EC 81 MG tablet Take 81 mg by mouth daily.    [provider]  furosemide (LASIX) 40 MG tablet Take 1 tablet (40 mg total) by mouth daily. 01/14/22 01/14/23  Willeen Niece, MD  hydrocortisone 2.5 % cream Apply 1 application. topically 2 (two) times daily as needed (itching).    [provider]  losartan (COZAAR) 100 MG tablet Take 100 mg by mouth daily.    [provider]  Omega-3 Fatty Acids (FISH OIL) 1000 MG CAPS Take 1 capsule by mouth 2 (two) times daily.    [provider]  omeprazole (PRILOSEC) 40 MG capsule Take 40 mg by mouth daily.    [provider]  vitamin B-12 (CYANOCOBALAMIN) 1000 MCG tablet Take 1,000 mcg by mouth daily.    [provider]    Physical  Exam: Vitals:   10/10/23 0915 10/10/23 0917  BP:  (!) 189/76  Pulse:  61  Resp:  18  Temp:  98 F (36.7 C)  SpO2:  100%  Weight: 77.1 kg   Height: 5\' 5"  (1.651 m)    Physical Exam Constitutional:      Appearance: He is normal weight.  HENT:     Head: Normocephalic and atraumatic.     Nose: Nose normal.     Mouth/Throat:     Mouth: Mucous membranes are moist.  Eyes:     Pupils: Pupils are equal, round, and reactive to light.  Cardiovascular:     Rate and Rhythm: Normal rate and regular rhythm.  Pulmonary:     Effort: Pulmonary effort is normal.  Abdominal:     General: Bowel sounds are normal.  Musculoskeletal:     Comments: L hand swelling     Skin:    Findings: Rash present.  Neurological:     General: No focal deficit present.  Psychiatric:        Mood and Affect: Mood normal.       Data Reviewed:  There are no new results to review at this time.  Lab Results  Component Value Date   WBC 10.9 (H) 10/10/2023   HGB 11.1 (L) 10/10/2023   HCT 34.4 (L) 10/10/2023   MCV 87.1 10/10/2023   PLT 311 10/10/2023   Last metabolic panel Lab Results  Component Value Date   GLUCOSE 136 (H) 10/10/2023   NA 135 10/10/2023   K 3.7 10/10/2023   CL 101 10/10/2023   CO2 20 (L) 10/10/2023   BUN 22 10/10/2023   CREATININE 1.08 10/10/2023   GFRNONAA >60 10/10/2023   CALCIUM 8.8 (L) 10/10/2023   PROT 7.5 01/12/2022   ALBUMIN 3.7 01/12/2022   BILITOT 1.2 01/12/2022   ALKPHOS 43 01/12/2022   AST 24 01/12/2022   ALT 22 01/12/2022   ANIONGAP 14 10/10/2023    Assessment and Plan: * Wound infection Worsening left hand infection status post fall work roughly 1 week ago Worsening left hand erythema redness swelling and pain IV cefepime and vancomycin for infectious coverage Blood cultures Formally evaluated by Dr. Rosann Auerbach w/ orthopedic surgery  Pending MRI hand to better assess for deeper infection  Follow up MRI and orthopedic recommendations  Follow closely   Atrial fibrillation, chronic (HCC) Rate controlled at present  Cont home regimen including eliquis  Follow    Essential hypertension BP stable  Titrate home regimen    (HFpEF) heart failure with preserved ejection fraction (HCC) 2D echo March 2023 EF of 55 to 60% Euvolemic Monitor  Diabetes mellitus without complication_diet controled Blood sugar in 130s  SSI  Monitor   Stroke (HCC) Continue aspirin and statin  GERD (gastroesophageal reflux disease) PPI      Advance Care Planning:   Code Status: Full Code   Consults: Orthopedic Surgery   Family Communication: Son at the bedside   Severity of Illness: The appropriate patient status for  this patient is INPATIENT. Inpatient status is judged to be reasonable and necessary in order to provide the required intensity of service to ensure the patient's safety. The patient's presenting symptoms, physical exam findings, and initial radiographic and laboratory data in the context of their chronic comorbidities is felt to place them at high risk for further clinical deterioration. Furthermore, it is not anticipated that the patient will be medically stable for discharge from the hospital within 2 midnights of admission.   *  I certify that at the point of admission it is my clinical judgment that the patient will require inpatient hospital care spanning beyond 2 midnights from the point of admission due to high intensity of service, high risk for further deterioration and high frequency of surveillance required.*  Author: Floydene Flock, MD 10/10/2023 2:29 PM  For on call review [email or web address].

## 2023-10-10 NOTE — Assessment & Plan Note (Signed)
Worsening left hand infection status post fall work roughly 1 week ago Worsening left hand erythema redness swelling and pain IV cefepime and vancomycin for infectious coverage Blood cultures Formally evaluated by Dr. Rosann Auerbach w/ orthopedic surgery  Pending MRI hand to better assess for deeper infection  Follow up MRI and orthopedic recommendations  Follow closely

## 2023-10-10 NOTE — Assessment & Plan Note (Signed)
PPI ?

## 2023-10-10 NOTE — Assessment & Plan Note (Signed)
Rate controlled at present  Cont home regimen including eliquis  Follow

## 2023-10-10 NOTE — Assessment & Plan Note (Signed)
BP stable Titrate home regimen 

## 2023-10-11 DIAGNOSIS — T148XXA Other injury of unspecified body region, initial encounter: Secondary | ICD-10-CM | POA: Diagnosis not present

## 2023-10-11 DIAGNOSIS — L089 Local infection of the skin and subcutaneous tissue, unspecified: Secondary | ICD-10-CM | POA: Diagnosis not present

## 2023-10-11 LAB — COMPREHENSIVE METABOLIC PANEL
ALT: 13 U/L (ref 0–44)
AST: 16 U/L (ref 15–41)
Albumin: 3.1 g/dL — ABNORMAL LOW (ref 3.5–5.0)
Alkaline Phosphatase: 40 U/L (ref 38–126)
Anion gap: 10 (ref 5–15)
BUN: 20 mg/dL (ref 8–23)
CO2: 25 mmol/L (ref 22–32)
Calcium: 8.5 mg/dL — ABNORMAL LOW (ref 8.9–10.3)
Chloride: 99 mmol/L (ref 98–111)
Creatinine, Ser: 1.02 mg/dL (ref 0.61–1.24)
GFR, Estimated: >60 mL/min (ref >60)
Glucose, Bld: 111 mg/dL — ABNORMAL HIGH (ref 70–99)
Potassium: 3.6 mmol/L (ref 3.5–5.1)
Sodium: 134 mmol/L — ABNORMAL LOW (ref 135–145)
Total Bilirubin: 1.2 mg/dL — ABNORMAL HIGH (ref <1.2)
Total Protein: 6.5 g/dL (ref 6.5–8.1)

## 2023-10-11 LAB — CBC
HCT: 29.9 % — ABNORMAL LOW (ref 39.0–52.0)
Hemoglobin: 9.6 g/dL — ABNORMAL LOW (ref 13.0–17.0)
MCH: 28.2 pg (ref 26.0–34.0)
MCHC: 32.1 g/dL (ref 30.0–36.0)
MCV: 87.7 fL (ref 80.0–100.0)
Platelets: 273 10*3/uL (ref 150–400)
RBC: 3.41 MIL/uL — ABNORMAL LOW (ref 4.22–5.81)
RDW: 13.3 % (ref 11.5–15.5)
WBC: 8.1 10*3/uL (ref 4.0–10.5)
nRBC: 0 % (ref 0.0–0.2)

## 2023-10-11 LAB — GLUCOSE, CAPILLARY
Glucose-Capillary: 115 mg/dL — ABNORMAL HIGH (ref 70–99)
Glucose-Capillary: 152 mg/dL — ABNORMAL HIGH (ref 70–99)
Glucose-Capillary: 123 mg/dL — ABNORMAL HIGH (ref 70–99)

## 2023-10-11 MED ORDER — SODIUM CHLORIDE 0.9 % IV SOLN
1.0000 g | INTRAVENOUS | Status: DC
  Administered 2023-10-11 – 2023-10-13 (×3): 1 g via INTRAVENOUS
  Filled 2023-10-11 (×3): qty 10

## 2023-10-11 MED ORDER — LOSARTAN POTASSIUM 50 MG PO TABS
100.0000 mg | ORAL_TABLET | Freq: Every day | ORAL | Status: DC
  Administered 2023-10-11 – 2023-10-13 (×3): 100 mg via ORAL
  Filled 2023-10-11 (×3): qty 2

## 2023-10-11 NOTE — Progress Notes (Signed)
PROGRESS NOTE    Ivan SHONTZ Sr.  ZOX:[id number] DOB: 1935-10-21 DOA: 10/10/2023 PCP: Gracelyn Nurse, MD     Brief Narrative:   From admission h and p Ivan Hering Sr. is a 88 y.o. male with medical history significant of AAA, atrial fibrillation, HFpEF, type 2 diabetes, hypertension, history of CVA presenting with left hand wound infection.  Patient reports falling on left hand roughly 1 week ago.  Has had worsening redness and swelling.  No fevers or chills.  Mild serosanguineous drainage.  Also with abrasions over left elbow.  Minimal to mild swelling.  No chest pain or shortness of breath.  No palpitations.  No reported alcohol or tobacco use.   Assessment & Plan:   Principal Problem:   Wound infection Active Problems:   Atrial fibrillation, chronic (HCC)   Essential hypertension   GERD (gastroesophageal reflux disease)   Stroke (HCC)   Diabetes mellitus without complication_diet controled   (HFpEF) heart failure with preserved ejection fraction (HCC)   Cellulitis Worsening left hand infection status post fall work roughly 1 week ago Worsening left hand erythema redness swelling and pain Started on doxy as outpatient 3 days prior to presentation Mri motion degraded but no signs of bone/tendon involvement, no abscess Seen by ortho but no note yet, have reached out to dr. Rosann Auerbach about that Improving today, less pain, better function, swelling improving Will f/u blood culture Continue vanc De-escalate cefepime to ceftriaxone    Atrial fibrillation, chronic (HCC) Rate controlled at present  Cont home regimen including eliquis     Essential hypertension BP stable  cont home regimen      (HFpEF) heart failure with preserved ejection fraction (HCC) 2D echo March 2023 EF of 55 to 60% Euvolemic Monitor   Diabetes mellitus without complication_diet controled Blood sugar in 130s  SSI  Monitor    Stroke (HCC) Continue aspirin and statin   GERD  (gastroesophageal reflux disease) PPI   DVT prophylaxis: apixaban Code Status: full Family Communication: daughter updated @ bedside 12/10  Level of care: Telemetry Medical Status is: Inpatient Remains inpatient appropriate because: need for IV abx    Consultants:  ortho  Procedures: none  Antimicrobials:  Vanc/cefepime>vanc/ceftriaxone    Subjective: Reports interval improvement in pain, function, and swelling of left hand  Objective: Vitals:   10/11/23 0500 10/11/23 0530 10/11/23 0545 10/11/23 0612  BP:    (!) 148/67  Pulse: (!) 25 65 (!) 58   Resp: 19 (!) 21 (!) 28   Temp:    98.8 F (37.1 C)  TempSrc:    Oral  SpO2: 98% 98% 97%   Weight:      Height:        Intake/Output Summary (Last 24 hours) at 10/11/2023 0935 Last data filed at 10/10/2023 1349 Gross per 24 hour  Intake 132.58 ml  Output 1500 ml  Net -1367.42 ml   Filed Weights   10/10/23 0915  Weight: 77.1 kg    Examination:  General exam: Appears calm and comfortable  Respiratory system: normal wob Cardiovascular system: S1 & S2 heard, RRR.   Gastrointestinal system: Abdomen is nondistended, soft and nontender.   Central nervous system: Alert and oriented. No focal neurological deficits. Extremities: Symmetric 5 x 5 power. Skin: abrasion dorsum left hand with surrounding erythema and swelling. Also abrasion left elbow without surrounding swelling and minimal surrounding erythema Psychiatry: Judgement and insight appear normal. Mood & affect appropriate.     Data Reviewed: I have personally  reviewed following labs and imaging studies  CBC: Recent Labs  Lab 10/10/23 0912 10/11/23 0501  WBC 10.9* 8.1  HGB 11.1* 9.6*  HCT 34.4* 29.9*  MCV 87.1 87.7  PLT 311 273   Basic Metabolic Panel: Recent Labs  Lab 10/10/23 0912 10/11/23 0501  NA 135 134*  K 3.7 3.6  CL 101 99  CO2 20* 25  GLUCOSE 136* 111*  BUN 22 20  CREATININE 1.08 1.02  CALCIUM 8.8* 8.5*   GFR: Estimated  Creatinine Clearance: 49.8 mL/min (by C-G formula based on SCr of 1.02 mg/dL). Liver Function Tests: Recent Labs  Lab 10/11/23 0501  AST 16  ALT 13  ALKPHOS 40  BILITOT 1.2*  PROT 6.5  ALBUMIN 3.1*   No results for input(s): "LIPASE", "AMYLASE" in the last 168 hours. No results for input(s): "AMMONIA" in the last 168 hours. Coagulation Profile: No results for input(s): "INR", "PROTIME" in the last 168 hours. Cardiac Enzymes: No results for input(s): "CKTOTAL", "CKMB", "CKMBINDEX", "TROPONINI" in the last 168 hours. BNP (last 3 results) No results for input(s): "PROBNP" in the last 8760 hours. HbA1C: Recent Labs    10/10/23 0912  HGBA1C 6.4*   CBG: Recent Labs  Lab 10/10/23 1642 10/10/23 2244  GLUCAP 150* 116*   Lipid Profile: No results for input(s): "CHOL", "HDL", "LDLCALC", "TRIG", "CHOLHDL", "LDLDIRECT" in the last 72 hours. Thyroid Function Tests: No results for input(s): "TSH", "T4TOTAL", "FREET4", "T3FREE", "THYROIDAB" in the last 72 hours. Anemia Panel: No results for input(s): "VITAMINB12", "FOLATE", "FERRITIN", "TIBC", "IRON", "RETICCTPCT" in the last 72 hours. Urine analysis: No results found for: "COLORURINE", "APPEARANCEUR", "LABSPEC", "PHURINE", "GLUCOSEU", "HGBUR", "BILIRUBINUR", "KETONESUR", "PROTEINUR", "UROBILINOGEN", "NITRITE", "LEUKOCYTESUR" Sepsis Labs: @LABRCNTIP (procalcitonin:4,lacticidven:4)  )No results found for this or any previous visit (from the past 240 hour(s)).       Radiology Studies: MR HAND LEFT WO CONTRAST  Result Date: 10/10/2023 CLINICAL DATA:  88 year old with left hand swelling after falling. No acute osseous findings identified on radiographs. EXAM: MRI OF THE LEFT HAND WITHOUT CONTRAST TECHNIQUE: Multiplanar, multisequence MR imaging of the left hand was performed. No intravenous contrast was administered. COMPARISON:  Radiographs same date.  No other comparison studies. FINDINGS: Technical note: Despite efforts by the  technologist and patient, moderate to severe motion artifact is present on today's exam and could not be eliminated. This reduces exam sensitivity and specificity. Bones/Joint/Cartilage Allowing for the motion artifact, no evidence of acute fracture or dislocation. There are moderately advanced degenerative changes at the 1st carpometacarpal articulation with osteophytes and mild radial subluxation. Lesser degenerative changes are present throughout the remainder of the wrist and the interphalangeal joints. There is an old ulnar styloid fracture. No significant joint effusions are seen. Ligaments Suboptimally evaluated due to motion. Grossly intact intercarpal ligaments and triangular fibrocartilage complex. Muscles and Tendons No focal muscular abnormalities are identified allowing for the motion. The wrist and hand tendons appear grossly intact. Trace sheath fluid within the 1st extensor tendon compartment at the level of the radiocarpal joint. Soft tissues Generalized subcutaneous edema, greatest dorsally and within the thenar eminence. Mild periarticular fluid surrounding the degenerated 1st carpometacarpal joint. No other focal fluid collections are identified. IMPRESSION: 1. Limited exam due to motion artifact. If clinically warranted, consider further evaluation with CT. 2. No evidence of acute fracture or dislocation. 3. Moderately advanced degenerative changes at the 1st carpometacarpal articulation with mild radial subluxation. 4. Nonspecific generalized subcutaneous edema, greatest dorsally and within the thenar eminence. No suspicious focal fluid collections are identified.  5. The wrist and hand tendons appear grossly intact. Electronically Signed   By: Carey Bullocks M.D.   On: 10/10/2023 15:54   DG Hand Complete Left  Result Date: 10/10/2023 CLINICAL DATA:  Left hand swelling after fall EXAM: LEFT HAND - COMPLETE 3 VIEW COMPARISON:  None Available. FINDINGS: There is no evidence of acute displaced  fracture or dislocation. Diffuse degenerative changes of the hand. Sequela of prior ulnar styloid fracture. Soft tissue swelling of the hand and index and middle fingers. IMPRESSION: 1. No acute displaced fracture or dislocation. 2. Soft tissue swelling of the hand and index and middle fingers. Electronically Signed   By: Agustin Cree M.D.   On: 10/10/2023 14:20        Scheduled Meds:  apixaban  5 mg Oral BID   aspirin EC  81 mg Oral Daily   hydrocortisone   Rectal BID   insulin aspart  0-6 Units Subcutaneous TID WC   pantoprazole  40 mg Oral Daily   Continuous Infusions:  sodium chloride 75 mL/hr at 10/10/23 1455   cefTRIAXone (ROCEPHIN)  IV     vancomycin       LOS: 1 day     Silvano Bilis, MD [facility]   If 7PM-7AM, please contact night-coverage [email or web address] Password [email or web address] 10/11/2023, 9:35 AM

## 2023-10-11 NOTE — Progress Notes (Signed)
ORTHOPEDIC PROGRESS NOTE  SUBJECTIVE:     The patient is alert and oriented x 3. The patient has a normal mood and affect. The patient is resting quietly in bed. He remains in the ED. His daughter was present and updated. Dr. Joice Lofts came by to see the patient yesterday afternoon per the patient's report.  He reports his hand pain and stiffness are improved about 50-60%  OBJECTIVE:  Vitals:   10/11/23 1132 10/11/23 1450  BP: (!) 177/70 (!) 155/66  Pulse:  62  Resp: 16 17  Temp: 98.1 F (36.7 C) 98.3 F (36.8 C)  SpO2: 97% 96%   The patient's erythema and swelling on the dorsum of his left hand has improved. Range of motion at the wrist and digits has also improved. CRT is 2 seconds. No tenderness is noted over the flexor tendon sheaths. Good sensation to light touch is present over all dermatomal patterns.   Labs:  Recent Labs    10/10/23 0912 10/11/23 0501  HGB 11.1* 9.6*   Recent Labs    10/10/23 0912 10/11/23 0501  WBC 10.9* 8.1  RBC 3.95* 3.41*  HCT 34.4* 29.9*  PLT 311 273   Recent Labs    10/10/23 0912 10/11/23 0501  NA 135 134*  K 3.7 3.6  CL 101 99  CO2 20* 25  BUN 22 20  CREATININE 1.08 1.02  GLUCOSE 136* 111*  CALCIUM 8.8* 8.5*   No results for input(s): "LABPT", "INR" in the last 72 hours.    ASSESSMENT:     The patient's orthopedic condition is stable and they continue to improve daily.   PLAN:  I recommend continuing the IV antibiotics until his symptoms had improved. We discussed that I was returning to [facility] and Dr. Joice Lofts, who cares for the patient routinely, will be following his progress.   Cecil Cranker M.D. 10/11/2023 5:09 PM

## 2023-10-11 NOTE — Consult Note (Signed)
ORTHOPEDIC CONSULTATION  Ivan JAQUES Sr. [id number]  10/11/2023  CC:  Chief Complaint  Patient presents with   Wound Infection    History of Present IlIness: The patient is a 88 y.o. male who I was consulted on 10-10-23 by Dr. Modesto Charon, the ED physician for left hand cellulitis. The patient was seen in the ED at that time, but I was on the way to the OR and the consult was documented later.   The patient fell onto his left hand several days ago and was seen at a local Urgent care where conservative treatment was recommended for the contusion and abrasion he suffered to the dorsal aspect of his left hand. Because of increased pain, swelling and redness they presented to the ED on 10-10-23. Orthopedics was consulted. Further questioning of the patient and a review of his medical record reveals the patient has a known history of hand osteoarthritis, especially of his bilateral thumb CMC joints. His son is present to assist with history.  Admission history for completeness: Ivan DESANTOS Sr. is a 88 y.o. male with medical history significant of AAA, atrial fibrillation, HFpEF, type 2 diabetes, hypertension, history of CVA presenting with left hand wound infection.  Patient reports falling on left hand roughly 1 week ago.  Has had worsening redness and swelling.  No fevers or chills.  Mild serosanguineous drainage.  Also with abrasions over left elbow.  Minimal to mild swelling.  No chest pain or shortness of breath.  No palpitations.  No reported alcohol or tobacco use.   PMH:  Past Medical History:  Diagnosis Date   AAA (abdominal aortic aneurysm) (HCC)    Arrhythmia    atrial fibrillation   B12 deficiency    CHF (congestive heart failure) (HCC)    Diabetes mellitus without complication (HCC)    Diverticulosis    Hypertension    Spinal stenosis    Stroke (HCC)     SH:  Past Surgical History:  Procedure Laterality Date   CHOLECYSTECTOMY      ALL:  Allergies  Allergen Reactions    Clindamycin/Lincomycin Other (See Comments)    unknown   Enalapril Maleate     Other reaction(s): Cough   Erythromycin Other (See Comments)    unknown   Nitrofurantoin     Other reaction(s): Unknown   Sulfa Antibiotics Other (See Comments)    unknown   Penicillins Rash   Phenobarbital Rash   Phenylbutazones Rash    MED:  Medications Prior to Admission  Medication Sig Dispense Refill Last Dose   apixaban (ELIQUIS) 5 MG TABS tablet Take 1 tablet (5 mg total) by mouth 2 (two) times daily. 60 tablet 5 10/10/2023 at AM   furosemide (LASIX) 40 MG tablet Take 1 tablet (40 mg total) by mouth daily. 30 tablet 1 10/10/2023 at AM   hydrocortisone 2.5 % cream Apply 1 application. topically 2 (two) times daily as needed (itching).   10/10/2023   losartan (COZAAR) 100 MG tablet Take 100 mg by mouth daily.   10/10/2023   magnesium oxide (MAG-OX) 400 (240 Mg) MG tablet Take 1 tablet by mouth daily.   10/10/2023   Omega-3 Fatty Acids (FISH OIL) 1000 MG CAPS Take 1 capsule by mouth 2 (two) times daily.   10/09/2023   omeprazole (PRILOSEC) 40 MG capsule Take 40 mg by mouth daily.   10/10/2023   vitamin B-12 (CYANOCOBALAMIN) 1000 MCG tablet Take 1,000 mcg by mouth daily.   10/10/2023   aspirin EC 81  MG tablet Take 81 mg by mouth daily. (Patient not taking: Reported on 10/10/2023)   Not Taking    All home medications have been reviewed as documented in the medication reconciliation portion of the patient record.  FH:  Family History  Problem Relation Age of Onset   Cancer Mother    Heart attack Father    Cancer Brother    Heart attack Brother    Heart Problems Son     Social:  reports that he has quit smoking. He has never used smokeless tobacco. He reports current alcohol use. He reports that he does not use drugs.  Review of Systems: General: Denies fever, chills, weight loss Eyes: Denies blurry vision, changes in vision ENT: Denies sore throat, congestions, nosebleeds CV: Denies chest pain,  palpitations Respiratory: Denies shortness of breath, wheezing, cough Gl: Denies abdominal pain, nausea, vomiting GU: Denies hematuria Integumentary: Denies rashes or lesions Neuro: Denies headache, dizziness Psych: Negative Hem/Onc: Denies easy bruising or bleeding disorders Musculoskeletal: See HPI above.  Vitals: BP (!) 155/66 (BP Location: Right Arm)   Pulse 62   Temp 98.3 F (36.8 C) (Oral)   Resp 17   Ht 5\' 5"  (1.651 m)   Wt 77.1 kg   SpO2 96%   BMI 28.29 kg/m    Physical Exam: General: Awake, alert and oriented, no acute distress. Eyes: Pupils reactive, EOMI, normal conjunctiva, no scleral icterus. HENT: Normocephalic, atraumatic, normal hearing, moist oral mucosa Neck: Supple, non-tender, no cervical lymphadenopathy. Lungs: Chest rise is symmetric, non-labored respiration, chest wall nontender to palpation Heart: Normal rate by palpation, normal peripheral perfusion Abdomen: Soft, non-tender, non-distended. Pelvis is stable. Skin: Skin envelope intact, dry and pink, no rashes or lesions, no signs of infection. Neurologic: Awake, alert, and oriented X3 Psychiatric: Cooperative, appropriate mood and affect.  Musculoskeletal: Evaluation of the patient's non-dominant left hand reveals moderate swelling and erythema over the dorsum of the left hand extending onto his fingers. Several areas of superficial abrasions are noted with no exudate present. The flexor and extensor mechanisms to all digits are intact. There is no areas of fluctuance indicating an abscess. There is no tenderness over the flexor tendon sheaths. Range of motion off all the digits is decreased from normal secondary to pain. The patient is neurologically intact distally. Wrist ROM is slightly decreased secondary to pain.    Radiographic findings: Radiographs of the patient's left hand reveal multiple joint degenerative changes, especially severe at the thumb [facility] joint. No acute fractures, subluxations or  dislocations are noted.  Left Hand MRI: The left hand MRI reveals the swelling of the soft tissues dorsally. No areas of abscess formation are are noted.   Labs:  Recent Labs    10/10/23 0912 10/11/23 0501  HGB 11.1* 9.6*   Recent Labs    10/10/23 0912 10/11/23 0501  WBC 10.9* 8.1  RBC 3.95* 3.41*  HCT 34.4* 29.9*  PLT 311 273   Recent Labs    10/10/23 0912 10/11/23 0501  NA 135 134*  K 3.7 3.6  CL 101 99  CO2 20* 25  BUN 22 20  CREATININE 1.08 1.02  GLUCOSE 136* 111*  CALCIUM 8.8* 8.5*   No results for input(s): "LABPT", "INR" in the last 72 hours.   Assessment/Plan:    Assessment: 88 year old right hand dominant male with left hand contusion, abrasions, and cellulitis without evidence of deep tissue involvement.   Plan: The patient's family at bedside was updated on the patient's current condition.  Their questions were answered. I discussed that I agree with admission for IV antibiotic treatment which has already been instituted. No surgery is indicated at this time. They are in agreement with this treatment plan.  Addendum: 1800hrs 10-10-23. After I finished in the OR, I re-evaluated the patient who was still in the ED. He was feeling better. I updated them on the imaging study results.  Cecil Cranker M.D. 10/11/2023 4:42 PM

## 2023-10-11 NOTE — Plan of Care (Signed)
  Problem: Coping: Goal: Ability to adjust to condition or change in health will improve Outcome: Progressing   Problem: Health Behavior/Discharge Planning: Goal: Ability to manage health-related needs will improve Outcome: Progressing   Problem: Metabolic: Goal: Ability to maintain appropriate glucose levels will improve Outcome: Progressing   Problem: Nutritional: Goal: Maintenance of adequate nutrition will improve Outcome: Progressing   Problem: Education: Goal: Knowledge of General Education information will improve Description: Including pain rating scale, medication(s)/side effects and non-pharmacologic comfort measures Outcome: Progressing   Problem: Health Behavior/Discharge Planning: Goal: Ability to manage health-related needs will improve Outcome: Progressing

## 2023-10-11 NOTE — ED Notes (Signed)
ED TO INPATIENT HANDOFF REPORT  ED Nurse Name and Phone #: Ariea Rochin [phone]  S Name/Age/Gender Ivan Hering Sr. 88 y.o. male Room/Bed: ED13HA/ED13HA  Code Status   Code Status: Full Code  Home/SNF/Other Home Patient oriented to: self, place, time, and situation Is this baseline? Yes   Triage Complete: Triage complete  Chief Complaint Wound infection [T14.8XXA, L08.9]  Triage Note Pt comes with possible infection to left hand. Pt states he had fall few days ago and went to UC. Pt states he went back bc it got worse. Pt sent over here from there. Pt has bandage in place with obvious redness ,warmth and swelling. Pt also states thumb hurts and sharp pain down it.    Allergies Allergies  Allergen Reactions   Clindamycin/Lincomycin Other (See Comments)    unknown   Enalapril Maleate     Other reaction(s): Cough   Erythromycin Other (See Comments)    unknown   Nitrofurantoin     Other reaction(s): Unknown   Sulfa Antibiotics Other (See Comments)    unknown   Penicillins Rash   Phenobarbital Rash   Phenylbutazones Rash    Level of Care/Admitting Diagnosis ED Disposition     ED Disposition  Admit   Condition  --   Comment  Hospital Area: [facility] [[id number]]  Level of Care: Telemetry Medical [104]  Covid Evaluation: Confirmed COVID Negative  Diagnosis: Wound infection [347425]  Admitting Physician: Floydene Flock [[id number]]  Attending Physician: Floydene Flock [phone]  Certification:: I certify this patient will need inpatient services for at least 2 midnights  Expected Medical Readiness: 10/13/2023          B Medical/Surgery History Past Medical History:  Diagnosis Date   AAA (abdominal aortic aneurysm) (HCC)    Arrhythmia    atrial fibrillation   B12 deficiency    CHF (congestive heart failure) (HCC)    Diabetes mellitus without complication (HCC)    Diverticulosis    Hypertension    Spinal stenosis    Stroke [facility])    Past  Surgical History:  Procedure Laterality Date   CHOLECYSTECTOMY       A IV Location/Drains/Wounds Patient Lines/Drains/Airways Status     Active Line/Drains/Airways     Name Placement date Placement time Site Days   Peripheral IV 10/10/23 20 G Left Antecubital 10/10/23  1111  Antecubital  1            Intake/Output Last 24 hours  Intake/Output Summary (Last 24 hours) at 10/11/2023 1014 Last data filed at 10/10/2023 1349 Gross per 24 hour  Intake 132.58 ml  Output 1500 ml  Net -1367.42 ml    Labs/Imaging Results for orders placed or performed during the hospital encounter of 10/10/23 (from the past 48 hour(s))  CBC     Status: Abnormal   Collection Time: 10/10/23  9:12 AM  Result Value Ref Range   WBC 10.9 (H) 4.0 - 10.5 K/uL   RBC 3.95 (L) 4.22 - 5.81 MIL/uL   Hemoglobin 11.1 (L) 13.0 - 17.0 g/dL   HCT 87.5 (L) 64.3 - 32.9 %   MCV 87.1 80.0 - 100.0 fL   MCH 28.1 26.0 - 34.0 pg   MCHC 32.3 30.0 - 36.0 g/dL   RDW 51.8 84.1 - 66.0 %   Platelets 311 150 - 400 K/uL   nRBC 0.0 0.0 - 0.2 %    Comment: Performed at [facility], [address], [place], [place] [address]  Basic  metabolic panel     Status: Abnormal   Collection Time: 10/10/23  9:12 AM  Result Value Ref Range   Sodium 135 135 - 145 mmol/L   Potassium 3.7 3.5 - 5.1 mmol/L   Chloride 101 98 - 111 mmol/L   CO2 20 (L) 22 - 32 mmol/L   Glucose, Bld 136 (H) 70 - 99 mg/dL    Comment: Glucose reference range applies only to samples taken after fasting for at least 8 hours.   BUN 22 8 - 23 mg/dL   Creatinine, Ser 3.08 0.61 - 1.24 mg/dL   Calcium 8.8 (L) 8.9 - 10.3 mg/dL   GFR, Estimated >65 >78 mL/min    Comment: (NOTE) Calculated using the CKD-EPI Creatinine Equation (2021)    Anion gap 14 5 - 15    Comment: Performed at [facility], [address] [place], [place] [address]  Lactic acid, plasma     Status: None   Collection Time: 10/10/23  9:12 AM  Result Value Ref Range   Lactic  Acid, Venous 0.9 0.5 - 1.9 mmol/L    Comment: Performed at [facility], [address] [place], [place] [address]  Hemoglobin A1c     Status: Abnormal   Collection Time: 10/10/23  9:12 AM  Result Value Ref Range   Hgb A1c MFr Bld 6.4 (H) 4.8 - 5.6 %    Comment: (NOTE) Pre diabetes:          5.7%-6.4%  Diabetes:              >6.4%  Glycemic control for   <7.0% adults with diabetes    Mean Plasma Glucose 136.98 mg/dL    Comment: Performed at [facility], [address], [place], [place] [address]  Lactic acid, plasma     Status: None   Collection Time: 10/10/23 11:27 AM  Result Value Ref Range   Lactic Acid, Venous 1.0 0.5 - 1.9 mmol/L    Comment: Performed at [facility], [address] [place], [place] [address]  CBG monitoring, ED     Status: Abnormal   Collection Time: 10/10/23  4:42 PM  Result Value Ref Range   Glucose-Capillary 150 (H) 70 - 99 mg/dL    Comment: Glucose reference range applies only to samples taken after fasting for at least 8 hours.  CBG monitoring, ED     Status: Abnormal   Collection Time: 10/10/23 10:44 PM  Result Value Ref Range   Glucose-Capillary 116 (H) 70 - 99 mg/dL    Comment: Glucose reference range applies only to samples taken after fasting for at least 8 hours.  CBC     Status: Abnormal   Collection Time: 10/11/23  5:01 AM  Result Value Ref Range   WBC 8.1 4.0 - 10.5 K/uL   RBC 3.41 (L) 4.22 - 5.81 MIL/uL   Hemoglobin 9.6 (L) 13.0 - 17.0 g/dL   HCT 25.3 (L) 66.4 - 40.3 %   MCV 87.7 80.0 - 100.0 fL   MCH 28.2 26.0 - 34.0 pg   MCHC 32.1 30.0 - 36.0 g/dL   RDW 47.4 25.9 - 56.3 %   Platelets 273 150 - 400 K/uL   nRBC 0.0 0.0 - 0.2 %    Comment: Performed at [facility], [address], [place], [place] [address]  Comprehensive metabolic panel     Status: Abnormal   Collection Time: 10/11/23  5:01 AM  Result Value Ref Range   Sodium  134 (L) 135 - 145 mmol/L   Potassium 3.6 3.5 - 5.1 mmol/L    Chloride 99 98 - 111 mmol/L   CO2 25 22 - 32 mmol/L   Glucose, Bld 111 (H) 70 - 99 mg/dL    Comment: Glucose reference range applies only to samples taken after fasting for at least 8 hours.   BUN 20 8 - 23 mg/dL   Creatinine, Ser 1.61 0.61 - 1.24 mg/dL   Calcium 8.5 (L) 8.9 - 10.3 mg/dL   Total Protein 6.5 6.5 - 8.1 g/dL   Albumin 3.1 (L) 3.5 - 5.0 g/dL   AST 16 15 - 41 U/L   ALT 13 0 - 44 U/L   Alkaline Phosphatase 40 38 - 126 U/L   Total Bilirubin 1.2 (H) <1.2 mg/dL   GFR, Estimated >09 >60 mL/min    Comment: (NOTE) Calculated using the CKD-EPI Creatinine Equation (2021)    Anion gap 10 5 - 15    Comment: Performed at [facility], [address] [place], [place] [address]   MR HAND LEFT WO CONTRAST  Result Date: 10/10/2023 CLINICAL DATA:  88 year old with left hand swelling after falling. No acute osseous findings identified on radiographs. EXAM: MRI OF THE LEFT HAND WITHOUT CONTRAST TECHNIQUE: Multiplanar, multisequence MR imaging of the left hand was performed. No intravenous contrast was administered. COMPARISON:  Radiographs same date.  No other comparison studies. FINDINGS: Technical note: Despite efforts by the technologist and patient, moderate to severe motion artifact is present on today's exam and could not be eliminated. This reduces exam sensitivity and specificity. Bones/Joint/Cartilage Allowing for the motion artifact, no evidence of acute fracture or dislocation. There are moderately advanced degenerative changes at the 1st carpometacarpal articulation with osteophytes and mild radial subluxation. Lesser degenerative changes are present throughout the remainder of the wrist and the interphalangeal joints. There is an old ulnar styloid fracture. No significant joint effusions are seen. Ligaments Suboptimally evaluated due to motion. Grossly intact intercarpal ligaments and triangular fibrocartilage complex. Muscles and Tendons No focal muscular abnormalities are  identified allowing for the motion. The wrist and hand tendons appear grossly intact. Trace sheath fluid within the 1st extensor tendon compartment at the level of the radiocarpal joint. Soft tissues Generalized subcutaneous edema, greatest dorsally and within the thenar eminence. Mild periarticular fluid surrounding the degenerated 1st carpometacarpal joint. No other focal fluid collections are identified. IMPRESSION: 1. Limited exam due to motion artifact. If clinically warranted, consider further evaluation with CT. 2. No evidence of acute fracture or dislocation. 3. Moderately advanced degenerative changes at the 1st carpometacarpal articulation with mild radial subluxation. 4. Nonspecific generalized subcutaneous edema, greatest dorsally and within the thenar eminence. No suspicious focal fluid collections are identified. 5. The wrist and hand tendons appear grossly intact. Electronically Signed   By: Carey Bullocks M.D.   On: 10/10/2023 15:54   DG Hand Complete Left  Result Date: 10/10/2023 CLINICAL DATA:  Left hand swelling after fall EXAM: LEFT HAND - COMPLETE 3 VIEW COMPARISON:  None Available. FINDINGS: There is no evidence of acute displaced fracture or dislocation. Diffuse degenerative changes of the hand. Sequela of prior ulnar styloid fracture. Soft tissue swelling of the hand and index and middle fingers. IMPRESSION: 1. No acute displaced fracture or dislocation. 2. Soft tissue swelling of the hand and index and middle fingers. Electronically Signed   By: Agustin Cree M.D.   On: 10/10/2023 14:20    Pending Labs Unresulted Labs (From admission, onward)  None       Vitals/Pain Today's Vitals   10/11/23 0530 10/11/23 0545 10/11/23 0612 10/11/23 0800  BP:   (!) 148/67   Pulse: 65 (!) 58  (!) 53  Resp: (!) 21 (!) 28  20  Temp:   98.8 F (37.1 C)   TempSrc:   Oral   SpO2: 98% 97%  97%  Weight:      Height:      PainSc:        Isolation Precautions No active  isolations  Medications Medications  ondansetron (ZOFRAN) tablet 4 mg (has no administration in time range)    Or  ondansetron (ZOFRAN) injection 4 mg (has no administration in time range)  pantoprazole (PROTONIX) EC tablet 40 mg (40 mg Oral Given 10/11/23 1006)  aspirin EC tablet 81 mg (81 mg Oral Given 10/11/23 1006)  insulin aspart (novoLOG) injection 0-6 Units ( Subcutaneous Not Given 10/11/23 0810)  vancomycin (VANCOCIN) IVPB 1000 mg/200 mL premix (has no administration in time range)  apixaban (ELIQUIS) tablet 5 mg (5 mg Oral Given 10/11/23 1007)  hydrocortisone (ANUSOL-HC) 2.5 % rectal cream ( Rectal Not Given 10/11/23 0936)  HYDROcodone-acetaminophen (NORCO/VICODIN) 5-325 MG per tablet 1 tablet (1 tablet Oral Given 10/11/23 0005)  cefTRIAXone (ROCEPHIN) 1 g in sodium chloride 0.9 % 100 mL IVPB (1 g Intravenous New Bag/Given 10/11/23 1006)  losartan (COZAAR) tablet 100 mg (100 mg Oral Given 10/11/23 1006)  fentaNYL (SUBLIMAZE) injection 50 mcg (50 mcg Intravenous Given 10/10/23 1113)  ceFEPIme (MAXIPIME) 2 g in sodium chloride 0.9 % 100 mL IVPB (0 g Intravenous Stopped 10/10/23 1227)  vancomycin (VANCOCIN) IVPB 1000 mg/200 mL premix (0 mg Intravenous Stopped 10/10/23 1356)    Followed by  vancomycin (VANCOREADY) IVPB 750 mg/150 mL (0 mg Intravenous Stopped 10/10/23 1609)  LORazepam (ATIVAN) injection 0.5 mg (0.5 mg Intravenous Given 10/10/23 1343)    Mobility walks     Focused Assessments Cardiac Assessment Handoff:  Cardiac Rhythm: Normal sinus rhythm No results found for: "CKTOTAL", "CKMB", "CKMBINDEX", "TROPONINI" No results found for: "DDIMER" Does the Patient currently have chest pain? No    R Recommendations: See Admitting Provider Note  Report given to:   Additional Notes:

## 2023-10-12 DIAGNOSIS — T148XXA Other injury of unspecified body region, initial encounter: Secondary | ICD-10-CM | POA: Diagnosis not present

## 2023-10-12 DIAGNOSIS — L089 Local infection of the skin and subcutaneous tissue, unspecified: Secondary | ICD-10-CM | POA: Diagnosis not present

## 2023-10-12 LAB — GLUCOSE, CAPILLARY
Glucose-Capillary: 116 mg/dL — ABNORMAL HIGH (ref 70–99)
Glucose-Capillary: 110 mg/dL — ABNORMAL HIGH (ref 70–99)
Glucose-Capillary: 160 mg/dL — ABNORMAL HIGH (ref 70–99)
Glucose-Capillary: 145 mg/dL — ABNORMAL HIGH (ref 70–99)

## 2023-10-12 LAB — CREATININE, SERUM
Creatinine, Ser: 1.03 mg/dL (ref 0.61–1.24)
GFR, Estimated: >60 mL/min (ref >60)

## 2023-10-12 LAB — MRSA NEXT GEN BY PCR, NASAL: MRSA by PCR Next Gen: NOT DETECTED

## 2023-10-12 MED ORDER — ACETAMINOPHEN 325 MG PO TABS
650.0000 mg | ORAL_TABLET | Freq: Once | ORAL | Status: AC
  Administered 2023-10-12: 650 mg via ORAL
  Filled 2023-10-12: qty 2

## 2023-10-12 MED ORDER — ACETAMINOPHEN 325 MG PO TABS: 650.0000 mg | ORAL_TABLET | Freq: Four times a day (QID) | ORAL | Status: DC | PRN

## 2023-10-12 MED ORDER — FUROSEMIDE 40 MG PO TABS
40.0000 mg | ORAL_TABLET | Freq: Every day | ORAL | Status: DC
  Administered 2023-10-12 – 2023-10-13 (×2): 40 mg via ORAL
  Filled 2023-10-12 (×2): qty 1

## 2023-10-12 NOTE — Progress Notes (Signed)
Patient ID: Ivan Hering Sr., male   DOB: 1935/05/20, 88 y.o.   MRN: [id number]  Subjective: The patient notes that his left hand pain, erythema, and swelling is much improved over the past 24 hours or so.  He still notes some stiffness/soreness with finger and hand range of motion but notes that this also is improving.  He has no new complaints pertaining to his left hand.   Objective: Vital signs in last 24 hours: Temp:  [98.2 F (36.8 C)-98.4 F (36.9 C)] 98.2 F (36.8 C) (12/11 1509) Pulse Rate:  [45-87] 45 (12/11 1509) Resp:  [16] 16 (12/11 1509) BP: (143-179)/(61-79) 143/61 (12/11 1509) SpO2:  [97 %-98 %] 97 % (12/11 1509)  Intake/Output from previous day: No intake/output data recorded. Intake/Output this shift: Total I/O In: 240 [P.O.:240] Out: -   Recent Labs    10/10/23 0912 10/11/23 0501  HGB 11.1* 9.6*   Recent Labs    10/10/23 0912 10/11/23 0501  WBC 10.9* 8.1  RBC 3.95* 3.41*  HCT 34.4* 29.9*  PLT 311 273   Recent Labs    10/10/23 0912 10/11/23 0501 10/12/23 0607  NA 135 134*  --   K 3.7 3.6  --   CL 101 99  --   CO2 20* 25  --   BUN 22 20  --   CREATININE 1.08 1.02 1.03  GLUCOSE 136* 111*  --   CALCIUM 8.8* 8.5*  --    No results for input(s): "LABPT", "INR" in the last 72 hours.  Physical Exam: Orthopedic examination is limited to the left hand.  The 2 scabs on the dorsal aspect of his hand/wrist region appear to be healing well.  There is no underlying drainage.  The erythema and swelling noted 2 days ago is markedly improved.  He has only minimal tenderness to palpation over the dorsal aspect of his hand.  He is able actively flex extend all digits, although he still has difficulty making a full fist due to stiffness in his fingers.  He is neurovascularly intact all digits.  Assessment: Left dorsal hand cellulitis status post abrasion injuries x 2, improving symptomatically.  Plan: The treatment options have been discussed with the  patient and his family who is at the bedside.  He appears to be responding well to the IV antibiotics.  His white count has decreased from 10.9-8.1.  He will complete his course of IV antibiotics and be converted over to p.o. antibiotics within the next 24 hours as planned by Dr. Georgeann Oppenheim, assuming he continues to respond well to the IV antibiotics.  He is instructed on some exercises to do with his left hand to improve its mobility and strength.  Thank you for asking [place] to participate in the care of this most delightful man.  We will sign off at this time.  Please make arrangements to have him follow-up with one of our PAs in 1 week for re-evaluation.  If you have further need of orthopedic input during this hospitalization, please reconsult [place].   Excell Seltzer Ariana Cavenaugh 10/12/2023, 3:46 PM

## 2023-10-12 NOTE — Progress Notes (Signed)
PROGRESS NOTE    Ivan CHAFFEY Sr.  [id number] DOB: 04-29-1935 DOA: 10/10/2023 PCP: Gracelyn Nurse, MD    Brief Narrative:   Ivan Hering Sr. is a 88 y.o. male with medical history significant of AAA, atrial fibrillation, HFpEF, type 2 diabetes, hypertension, history of CVA presenting with left hand wound infection.  Patient reports falling on left hand roughly 1 week ago.  Has had worsening redness and swelling.  No fevers or chills.  Mild serosanguineous drainage.  Also with abrasions over left elbow.  Minimal to mild swelling.  No chest pain or shortness of breath.  No palpitations.  No reported alcohol or tobacco use.      Assessment & Plan:   Principal Problem:   Wound infection Active Problems:   Atrial fibrillation, chronic (HCC)   Essential hypertension   GERD (gastroesophageal reflux disease)   Stroke (HCC)   Diabetes mellitus without complication_diet controled   (HFpEF) heart failure with preserved ejection fraction (HCC)  Left hand cellulitis Worsening left hand infection status post fall work roughly 1 week ago Worsening left hand erythema redness swelling and pain Started on doxy as outpatient 3 days prior to presentation Mri motion degraded but no signs of bone/tendon involvement, no abscess Seen by orthopedics.  No intervention required from their standpoint Improving day by day Plan: Continue vancomycin and ceftriaxone for now If physical exam improves plan to de-escalate to oral antibiotics and discharge 12/12  Atrial fibrillation, chronic (HCC) Rate controlled at present, continue rate control strategy Continue anticoagulation with Eliquis     Essential hypertension BP stable  cont home regimen      (HFpEF) heart failure with preserved ejection fraction (HCC) 2D echo March 2023 EF of 55 to 60% Euvolemic Monitor   Diabetes mellitus without complication_diet controled Blood sugar in 130s  SSI  Monitor    Stroke (HCC) Continue  aspirin and statin   GERD (gastroesophageal reflux disease) PPI      DVT prophylaxis: Apixaban Code Status: Full Family Communication: Daughter-in-law at bedside 12/11 Disposition Plan: Status is: Inpatient Remains inpatient appropriate because: Hand cellulitis on IV antibiotics.  Clinically improving.  Anticipate discharge 12/12.   Level of care: Telemetry Medical  Consultants:  Orthopedics  Procedures:  None  Antimicrobials: Vancomycin Ceftriaxone   Subjective: Seen and examined.  Sitting comfortably in bed.  No visible distress.  Pain control improved.  Objective: Vitals:   10/11/23 1132 10/11/23 1450 10/11/23 2316 10/12/23 0757  BP: (!) 177/70 (!) 155/66 (!) 179/67 (!) 177/79  Pulse:  62 63 87  Resp: 16 17 16 16   Temp: 98.1 F (36.7 C) 98.3 F (36.8 C) 98.4 F (36.9 C) 98.3 F (36.8 C)  TempSrc:  Oral Oral   SpO2: 97% 96% 97% 98%  Weight:      Height:        Intake/Output Summary (Last 24 hours) at 10/12/2023 1437 Last data filed at 10/12/2023 1055 Gross per 24 hour  Intake 240 ml  Output --  Net 240 ml   Filed Weights   10/10/23 0915  Weight: 77.1 kg    Examination:  General exam: Appears calm and comfortable  Respiratory system: Clear to auscultation. Respiratory effort normal. Cardiovascular system: S1-S2, regular rate, irregular rhythm, no murmurs, no pedal edema Gastrointestinal system: Soft, NT/ND, normal bowel sounds Central nervous system: Alert and oriented. No focal neurological deficits. Extremities: Left hand swollen up to level of mid forearm Skin: Left cellulitic changes associated with swelling and erythema Psychiatry:  Judgement and insight appear normal. Mood & affect appropriate.     Data Reviewed: I have personally reviewed following labs and imaging studies  CBC: Recent Labs  Lab 10/10/23 0912 10/11/23 0501  WBC 10.9* 8.1  HGB 11.1* 9.6*  HCT 34.4* 29.9*  MCV 87.1 87.7  PLT 311 273   Basic Metabolic  Panel: Recent Labs  Lab 10/10/23 0912 10/11/23 0501 10/12/23 0607  NA 135 134*  --   K 3.7 3.6  --   CL 101 99  --   CO2 20* 25  --   GLUCOSE 136* 111*  --   BUN 22 20  --   CREATININE 1.08 1.02 1.03  CALCIUM 8.8* 8.5*  --    GFR: Estimated Creatinine Clearance: 49.3 mL/min (by C-G formula based on SCr of 1.03 mg/dL). Liver Function Tests: Recent Labs  Lab 10/11/23 0501  AST 16  ALT 13  ALKPHOS 40  BILITOT 1.2*  PROT 6.5  ALBUMIN 3.1*   No results for input(s): "LIPASE", "AMYLASE" in the last 168 hours. No results for input(s): "AMMONIA" in the last 168 hours. Coagulation Profile: No results for input(s): "INR", "PROTIME" in the last 168 hours. Cardiac Enzymes: No results for input(s): "CKTOTAL", "CKMB", "CKMBINDEX", "TROPONINI" in the last 168 hours. BNP (last 3 results) No results for input(s): "PROBNP" in the last 8760 hours. HbA1C: Recent Labs    10/10/23 0912  HGBA1C 6.4*   CBG: Recent Labs  Lab 10/11/23 1156 10/11/23 1550 10/11/23 2102 10/12/23 0754 10/12/23 1205  GLUCAP 115* 152* 123* 116* 110*   Lipid Profile: No results for input(s): "CHOL", "HDL", "LDLCALC", "TRIG", "CHOLHDL", "LDLDIRECT" in the last 72 hours. Thyroid Function Tests: No results for input(s): "TSH", "T4TOTAL", "FREET4", "T3FREE", "THYROIDAB" in the last 72 hours. Anemia Panel: No results for input(s): "VITAMINB12", "FOLATE", "FERRITIN", "TIBC", "IRON", "RETICCTPCT" in the last 72 hours. Sepsis Labs: Recent Labs  Lab 10/10/23 0912 10/10/23 1127  LATICACIDVEN 0.9 1.0    No results found for this or any previous visit (from the past 240 hour(s)).       Radiology Studies: No results found.      Scheduled Meds:  apixaban  5 mg Oral BID   aspirin EC  81 mg Oral Daily   furosemide  40 mg Oral Daily   hydrocortisone   Rectal BID   insulin aspart  0-6 Units Subcutaneous TID WC   losartan  100 mg Oral Daily   pantoprazole  40 mg Oral Daily   Continuous  Infusions:  cefTRIAXone (ROCEPHIN)  IV 1 g (10/12/23 1610)   vancomycin 1,000 mg (10/11/23 1430)     LOS: 2 days    Tresa Moore, MD [facility]   If 7PM-7AM, please contact night-coverage  10/12/2023, 2:37 PM

## 2023-10-12 NOTE — Progress Notes (Signed)
Patient heart rate dropped throughout the night while asleep. When awakened it would increase. Asymptomatic. Able to self ambulate and denies chest pain or dizziness. Notified on-coming shift to be alert.

## 2023-10-13 DIAGNOSIS — T148XXA Other injury of unspecified body region, initial encounter: Secondary | ICD-10-CM | POA: Diagnosis not present

## 2023-10-13 DIAGNOSIS — L089 Local infection of the skin and subcutaneous tissue, unspecified: Secondary | ICD-10-CM | POA: Diagnosis not present

## 2023-10-13 LAB — GLUCOSE, CAPILLARY
Glucose-Capillary: 115 mg/dL — ABNORMAL HIGH (ref 70–99)
Glucose-Capillary: 113 mg/dL — ABNORMAL HIGH (ref 70–99)

## 2023-10-13 MED ORDER — CEPHALEXIN 500 MG PO CAPS: 500.0000 mg | ORAL_CAPSULE | Freq: Four times a day (QID) | ORAL | 0 refills | Status: AC

## 2023-10-13 NOTE — Care Management Important Message (Signed)
Important Message  Patient Details  Name: Ivan SLAVEY Sr. MRN: [id number] Date of Birth: Dec 10, 1934   Important Message Given:  Yes - Medicare IM     Verita Schneiders Shakthi Scipio 10/13/2023, 10:18 AM

## 2023-10-13 NOTE — Plan of Care (Signed)
Patient ID: Ivan Hering Sr., male   DOB: 06/25/1935, 88 y.o.   MRN: [id number]  Problem: Education: Goal: Ability to describe self-care measures that may prevent or decrease complications (Diabetes Survival Skills Education) will improve Outcome: Adequate for Discharge Goal: Individualized Educational Video(s) Outcome: Adequate for Discharge   Problem: Coping: Goal: Ability to adjust to condition or change in health will improve Outcome: Adequate for Discharge   Problem: Fluid Volume: Goal: Ability to maintain a balanced intake and output will improve Outcome: Adequate for Discharge   Problem: Health Behavior/Discharge Planning: Goal: Ability to identify and utilize available resources and services will improve Outcome: Adequate for Discharge Goal: Ability to manage health-related needs will improve Outcome: Adequate for Discharge   Problem: Metabolic: Goal: Ability to maintain appropriate glucose levels will improve Outcome: Adequate for Discharge   Problem: Nutritional: Goal: Maintenance of adequate nutrition will improve Outcome: Adequate for Discharge Goal: Progress toward achieving an optimal weight will improve Outcome: Adequate for Discharge   Problem: Skin Integrity: Goal: Risk for impaired skin integrity will decrease Outcome: Adequate for Discharge   Problem: Tissue Perfusion: Goal: Adequacy of tissue perfusion will improve Outcome: Adequate for Discharge   Problem: Education: Goal: Knowledge of General Education information will improve Description: Including pain rating scale, medication(s)/side effects and non-pharmacologic comfort measures Outcome: Adequate for Discharge   Problem: Health Behavior/Discharge Planning: Goal: Ability to manage health-related needs will improve Outcome: Adequate for Discharge   Problem: Clinical Measurements: Goal: Ability to maintain clinical measurements within normal limits will improve Outcome: Adequate for  Discharge Goal: Will remain free from infection Outcome: Adequate for Discharge Goal: Diagnostic test results will improve Outcome: Adequate for Discharge Goal: Respiratory complications will improve Outcome: Adequate for Discharge Goal: Cardiovascular complication will be avoided Outcome: Adequate for Discharge   Problem: Activity: Goal: Risk for activity intolerance will decrease Outcome: Adequate for Discharge   Problem: Nutrition: Goal: Adequate nutrition will be maintained Outcome: Adequate for Discharge   Problem: Coping: Goal: Level of anxiety will decrease Outcome: Adequate for Discharge   Problem: Elimination: Goal: Will not experience complications related to bowel motility Outcome: Adequate for Discharge Goal: Will not experience complications related to urinary retention Outcome: Adequate for Discharge   Problem: Pain Management: Goal: General experience of comfort will improve Outcome: Adequate for Discharge   Problem: Safety: Goal: Ability to remain free from injury will improve Outcome: Adequate for Discharge   Problem: Skin Integrity: Goal: Risk for impaired skin integrity will decrease Outcome: Adequate for Discharge    Lidia Collum, RN

## 2023-10-13 NOTE — Plan of Care (Signed)
  Problem: Fluid Volume: Goal: Ability to maintain a balanced intake and output will improve Outcome: Progressing   Problem: Health Behavior/Discharge Planning: Goal: Ability to identify and utilize available resources and services will improve Outcome: Progressing Goal: Ability to manage health-related needs will improve Outcome: Progressing   Problem: Metabolic: Goal: Ability to maintain appropriate glucose levels will improve Outcome: Progressing   Problem: Nutritional: Goal: Maintenance of adequate nutrition will improve Outcome: Progressing

## 2023-10-13 NOTE — Discharge Summary (Signed)
Physician Discharge Summary  EDDY MORONTA Lewis. WUX:[id number] DOB: [birth year] DOA: 10/10/2023  PCP: Gracelyn Nurse, MD  Admit date: 10/10/2023 Discharge date: 10/13/2023  Admitted From: Home Disposition:  Home  Recommendations for Outpatient Follow-up:  Follow up with PCP in 1-2 weeks Follow-up orthopedics 1 week  Home Health: No Equipment/Devices: No  Discharge Condition: Stable CODE STATUS: Full Diet recommendation: Regular  Brief/Interim Summary:  Ivan Lewis. is a 88 y.o. male with medical history significant of AAA, atrial fibrillation, HFpEF, type 2 diabetes, hypertension, history of CVA presenting with left hand wound infection.  Patient reports falling on left hand roughly 1 week ago.  Has had worsening redness and swelling.  No fevers or chills.  Mild serosanguineous drainage.  Also with abrasions over left elbow.  Minimal to mild swelling.  No chest pain or shortness of breath.  No palpitations.  No reported alcohol or tobacco use.       Discharge Diagnoses:  Principal Problem:   Wound infection Active Problems:   Atrial fibrillation, chronic (HCC)   Essential hypertension   GERD (gastroesophageal reflux disease)   Stroke (HCC)   Diabetes mellitus without complication_diet controled   (HFpEF) heart failure with preserved ejection fraction (HCC)  Left hand cellulitis Worsening left hand infection status post fall work roughly 1 week ago Worsening left hand erythema redness swelling and pain Started on doxy as outpatient 3 days prior to presentation Mri motion degraded but no signs of bone/tendon involvement, no abscess Seen by orthopedics.  No intervention required from their standpoint Improving day by day Plan: Stable for discharge.  De-escalate antibiotics to cephalexin 500 mg every 6 hours.  Complete additional 7 days for total 10-day antibiotic course.  Follow-up outpatient with orthopedics PA in 1 week   Discharge Instructions  Discharge  Instructions     Diet - low sodium heart healthy   Complete by: As directed    Increase activity slowly   Complete by: As directed       Allergies as of 10/13/2023       Reactions   Clindamycin/lincomycin Other (See Comments)   unknown   Enalapril Maleate    Other reaction(s): Cough   Erythromycin Other (See Comments)   unknown   Nitrofurantoin    Other reaction(s): Unknown   Sulfa Antibiotics Other (See Comments)   unknown   Penicillins Rash   Phenobarbital Rash   Phenylbutazones Rash        Medication List     STOP taking these medications    aspirin EC 81 MG tablet       TAKE these medications    apixaban 5 MG Tabs tablet Commonly known as: ELIQUIS Take 1 tablet (5 mg total) by mouth 2 (two) times daily.   cephALEXin 500 MG capsule Commonly known as: Keflex Take 1 capsule (500 mg total) by mouth 4 (four) times daily for 7 days.   cyanocobalamin 1000 MCG tablet Commonly known as: VITAMIN B12 Take 1,000 mcg by mouth daily.   Fish Oil 1000 MG Caps Take 1 capsule by mouth 2 (two) times daily.   furosemide 40 MG tablet Commonly known as: Lasix Take 1 tablet (40 mg total) by mouth daily.   hydrocortisone 2.5 % cream Apply 1 application. topically 2 (two) times daily as needed (itching).   losartan 100 MG tablet Commonly known as: COZAAR Take 100 mg by mouth daily.   magnesium oxide 400 (240 Mg) MG tablet Commonly known as: MAG-OX Take 1 tablet  by mouth daily.   omeprazole 40 MG capsule Commonly known as: PRILOSEC Take 40 mg by mouth daily.        Follow-up Information     Gracelyn Nurse, MD. Go on 10/19/2023.   Specialty: Internal Medicine Why: Appt @ 3:30pm Contact information: [address] [facility] [place] [address] [phone]         Poggi, Excell Seltzer, MD. Go on 10/31/2023.   Specialty: Orthopedic Surgery Why: 3:45 pm Contact information: [address] [facility] [place]  [phone]                Allergies  Allergen Reactions   Clindamycin/Lincomycin Other (See Comments)    unknown   Enalapril Maleate     Other reaction(s): Cough   Erythromycin Other (See Comments)    unknown   Nitrofurantoin     Other reaction(s): Unknown   Sulfa Antibiotics Other (See Comments)    unknown   Penicillins Rash   Phenobarbital Rash   Phenylbutazones Rash    Consultations: Orthopedics   Procedures/Studies: MR HAND LEFT WO CONTRAST Result Date: 10/10/2023 CLINICAL DATA:  88 year old with left hand swelling after falling. No acute osseous findings identified on radiographs. EXAM: MRI OF THE LEFT HAND WITHOUT CONTRAST TECHNIQUE: Multiplanar, multisequence MR imaging of the left hand was performed. No intravenous contrast was administered. COMPARISON:  Radiographs same date.  No other comparison studies. FINDINGS: Technical note: Despite efforts by the technologist and patient, moderate to severe motion artifact is present on today's exam and could not be eliminated. This reduces exam sensitivity and specificity. Bones/Joint/Cartilage Allowing for the motion artifact, no evidence of acute fracture or dislocation. There are moderately advanced degenerative changes at the 1st carpometacarpal articulation with osteophytes and mild radial subluxation. Lesser degenerative changes are present throughout the remainder of the wrist and the interphalangeal joints. There is an old ulnar styloid fracture. No significant joint effusions are seen. Ligaments Suboptimally evaluated due to motion. Grossly intact intercarpal ligaments and triangular fibrocartilage complex. Muscles and Tendons No focal muscular abnormalities are identified allowing for the motion. The wrist and hand tendons appear grossly intact. Trace sheath fluid within the 1st extensor tendon compartment at the level of the radiocarpal joint. Soft tissues Generalized subcutaneous edema, greatest dorsally and  within the thenar eminence. Mild periarticular fluid surrounding the degenerated 1st carpometacarpal joint. No other focal fluid collections are identified. IMPRESSION: 1. Limited exam due to motion artifact. If clinically warranted, consider further evaluation with CT. 2. No evidence of acute fracture or dislocation. 3. Moderately advanced degenerative changes at the 1st carpometacarpal articulation with mild radial subluxation. 4. Nonspecific generalized subcutaneous edema, greatest dorsally and within the thenar eminence. No suspicious focal fluid collections are identified. 5. The wrist and hand tendons appear grossly intact. Electronically Signed   By: Carey Bullocks M.D.   On: 10/10/2023 15:54   DG Hand Complete Left Result Date: 10/10/2023 CLINICAL DATA:  Left hand swelling after fall EXAM: LEFT HAND - COMPLETE 3 VIEW COMPARISON:  None Available. FINDINGS: There is no evidence of acute displaced fracture or dislocation. Diffuse degenerative changes of the hand. Sequela of prior ulnar styloid fracture. Soft tissue swelling of the hand and index and middle fingers. IMPRESSION: 1. No acute displaced fracture or dislocation. 2. Soft tissue swelling of the hand and index and middle fingers. Electronically Signed   By: Agustin Cree M.D.   On: 10/10/2023 14:20      Subjective: Seen and  examined on the day of discharge.  Stable no distress.  Hand swelling improved.  Grip strength improved.  Stable for discharge home.  Discharge Exam: Vitals:   10/12/23 2102 10/13/23 0828  BP: (!) 174/89 (!) 174/74  Pulse: (!) 59 (!) 45  Resp: 18   Temp: 98.3 F (36.8 C) 98.1 F (36.7 C)  SpO2: 98% 97%   Vitals:   10/12/23 0757 10/12/23 1509 10/12/23 2102 10/13/23 0828  BP: (!) 177/79 (!) 143/61 (!) 174/89 (!) 174/74  Pulse: 87 (!) 45 (!) 59 (!) 45  Resp: 16 16 18    Temp: 98.3 F (36.8 C) 98.2 F (36.8 C) 98.3 F (36.8 C) 98.1 F (36.7 C)  TempSrc:   Oral Oral  SpO2: 98% 97% 98% 97%  Weight:       Height:        General: Pt is alert, awake, not in acute distress Cardiovascular: RRR, S1/S2 +, no rubs, no gallops Respiratory: CTA bilaterally, no wheezing, no rhonchi Abdominal: Soft, NT, ND, bowel sounds + Extremities: Left hand swollen up to level of mid forearm    The results of significant diagnostics from this hospitalization (including imaging, microbiology, ancillary and laboratory) are listed below for reference.     Microbiology: Recent Results (from the past 240 hours)  MRSA Next Gen by PCR, Nasal     Status: None   Collection Time: 10/12/23  2:41 PM   Specimen: Nasal Mucosa; Nasal Swab  Result Value Ref Range Status   MRSA by PCR Next Gen NOT DETECTED NOT DETECTED Final    Comment: (NOTE) The GeneXpert MRSA Assay (FDA approved for NASAL specimens only), is one component of a comprehensive MRSA colonization surveillance program. It is not intended to diagnose MRSA infection nor to guide or monitor treatment for MRSA infections. Test performance is not FDA approved in patients less than 36 years old. Performed at [facility], [address] [place], [place] [address]      Labs: BNP (last 3 results) No results for input(s): "BNP" in the last 8760 hours. Basic Metabolic Panel: Recent Labs  Lab 10/10/23 0912 10/11/23 0501 10/12/23 0607  NA 135 134*  --   K 3.7 3.6  --   CL 101 99  --   CO2 20* 25  --   GLUCOSE 136* 111*  --   BUN 22 20  --   CREATININE 1.08 1.02 1.03  CALCIUM 8.8* 8.5*  --    Liver Function Tests: Recent Labs  Lab 10/11/23 0501  AST 16  ALT 13  ALKPHOS 40  BILITOT 1.2*  PROT 6.5  ALBUMIN 3.1*   No results for input(s): "LIPASE", "AMYLASE" in the last 168 hours. No results for input(s): "AMMONIA" in the last 168 hours. CBC: Recent Labs  Lab 10/10/23 0912 10/11/23 0501  WBC 10.9* 8.1  HGB 11.1* 9.6*  HCT 34.4* 29.9*  MCV 87.1 87.7  PLT 311 273   Cardiac Enzymes: No results for input(s): "CKTOTAL", "CKMB",  "CKMBINDEX", "TROPONINI" in the last 168 hours. BNP: Invalid input(s): "POCBNP" CBG: Recent Labs  Lab 10/12/23 1205 10/12/23 1628 10/12/23 2049 10/13/23 0819 10/13/23 1220  GLUCAP 110* 160* 145* 115* 113*   D-Dimer No results for input(s): "DDIMER" in the last 72 hours. Hgb A1c No results for input(s): "HGBA1C" in the last 72 hours. Lipid Profile No results for input(s): "CHOL", "HDL", "LDLCALC", "TRIG", "CHOLHDL", "LDLDIRECT" in the last 72 hours. Thyroid function studies No results for input(s): "TSH", "T4TOTAL", "T3FREE", "THYROIDAB"  in the last 72 hours.  Invalid input(s): "FREET3" Anemia work up No results for input(s): "VITAMINB12", "FOLATE", "FERRITIN", "TIBC", "IRON", "RETICCTPCT" in the last 72 hours. Urinalysis No results found for: "COLORURINE", "APPEARANCEUR", "LABSPEC", "PHURINE", "GLUCOSEU", "HGBUR", "BILIRUBINUR", "KETONESUR", "PROTEINUR", "UROBILINOGEN", "NITRITE", "LEUKOCYTESUR" Sepsis Labs Recent Labs  Lab 10/10/23 0912 10/11/23 0501  WBC 10.9* 8.1   Microbiology Recent Results (from the past 240 hours)  MRSA Next Gen by PCR, Nasal     Status: None   Collection Time: 10/12/23  2:41 PM   Specimen: Nasal Mucosa; Nasal Swab  Result Value Ref Range Status   MRSA by PCR Next Gen NOT DETECTED NOT DETECTED Final    Comment: (NOTE) The GeneXpert MRSA Assay (FDA approved for NASAL specimens only), is one component of a comprehensive MRSA colonization surveillance program. It is not intended to diagnose MRSA infection nor to guide or monitor treatment for MRSA infections. Test performance is not FDA approved in patients less than 48 years old. Performed at [facility], [address] [place], [place] [address]      Time coordinating discharge: Over 30 minutes  SIGNED:   Tresa Moore, MD  [facility] 10/13/2023, 12:36 PM Pager   If 7PM-7AM, please contact night-coverage

## 2023-10-20 ENCOUNTER — Other Ambulatory Visit: Payer: Self-pay | Admitting: Family Medicine

## 2023-10-20 DIAGNOSIS — M48062 Spinal stenosis, lumbar region with neurogenic claudication: Secondary | ICD-10-CM

## 2023-10-22 ENCOUNTER — Ambulatory Visit
Admission: RE | Admit: 2023-10-22 | Discharge: 2023-10-22 | Disposition: A | Discharge status: Home / Self Care | Payer: Medicare PPO | Source: Ambulatory Visit | Attending: Family Medicine | Admitting: Family Medicine

## 2023-10-22 DIAGNOSIS — M48062 Spinal stenosis, lumbar region with neurogenic claudication: Secondary | ICD-10-CM

## 2023-12-22 ENCOUNTER — Other Ambulatory Visit (INDEPENDENT_AMBULATORY_CARE_PROVIDER_SITE_OTHER): Payer: Self-pay | Admitting: Vascular Surgery

## 2023-12-22 DIAGNOSIS — I7143 Infrarenal abdominal aortic aneurysm, without rupture: Secondary | ICD-10-CM

## 2023-12-24 NOTE — Progress Notes (Unsigned)
 MRN : [id number]  Ivan MATTERS Sr. is a 89 y.o. (08/30/1935) male who presents with chief complaint of check circulation.  History of Present Illness:   The patient returns to the office for surveillance of a known abdominal aortic aneurysm. Patient denies abdominal pain or back pain, no other abdominal complaints. No changes suggesting embolic episodes.    There have been no interval changes in the patient's overall health care since his last visit.   Patient denies amaurosis fugax or TIA symptoms. There is no history of claudication or rest pain symptoms of the lower extremities. The patient denies angina or shortness of breath.    Duplex US of the aorta and iliac arteries shows an AAA measured 2.9 cm. (Previous duplex US of the aorta and iliac arteries shows an AAA measured 2.7 cm)  No outpatient medications have been marked as taking for the 12/26/23 encounter (Appointment) with Gilda Crease, Latina Craver, MD.    Past Medical History:  Diagnosis Date   AAA (abdominal aortic aneurysm) (HCC)    Arrhythmia    atrial fibrillation   B12 deficiency    CHF (congestive heart failure) (HCC)    Diabetes mellitus without complication (HCC)    Diverticulosis    Hypertension    Spinal stenosis    Stroke [facility])     Past Surgical History:  Procedure Laterality Date   CHOLECYSTECTOMY      Social History Social History   Tobacco Use   Smoking status: Former   Smokeless tobacco: Never  Substance Use Topics   Alcohol use: Yes   Drug use: Never    Family History Family History  Problem Relation Age of Onset   Cancer Mother    Heart attack Father    Cancer Brother    Heart attack Brother    Heart Problems Son     Allergies  Allergen Reactions   Clindamycin/Lincomycin Other (See Comments)    unknown   Enalapril Maleate     Other reaction(s): Cough   Erythromycin Other (See Comments)    unknown    Nitrofurantoin     Other reaction(s): Unknown   Sulfa Antibiotics Other (See Comments)    unknown   Penicillins Rash   Phenobarbital Rash   Phenylbutazones Rash     REVIEW OF SYSTEMS (Negative unless checked)  Constitutional: [] Weight loss  [] Fever  [] Chills Cardiac: [] Chest pain   [] Chest pressure   [] Palpitations   [] Shortness of breath when laying flat   [] Shortness of breath with exertion. Vascular:  [x] Pain in legs with walking   [] Pain in legs at rest  [] History of DVT   [] Phlebitis   [] Swelling in legs   [] Varicose veins   [] Non-healing ulcers Pulmonary:   [] Uses home oxygen   [] Productive cough   [] Hemoptysis   [] Wheeze  [] COPD   [] Asthma Neurologic:  [] Dizziness   [] Seizures   [] History of stroke   [] History of TIA  [] Aphasia   [] Vissual changes   [] Weakness or numbness in arm   [] Weakness or numbness in leg Musculoskeletal:   [] Joint swelling   [] Joint pain   []   Low back pain Hematologic:  [] Easy bruising  [] Easy bleeding   [] Hypercoagulable state   [] Anemic Gastrointestinal:  [] Diarrhea   [] Vomiting  [] Gastroesophageal reflux/heartburn   [] Difficulty swallowing. Genitourinary:  [] Chronic kidney disease   [] Difficult urination  [] Frequent urination   [] Blood in urine Skin:  [] Rashes   [] Ulcers  Psychological:  [] History of anxiety   []  History of major depression.  Physical Examination  There were no vitals filed for this visit. There is no height or weight on file to calculate BMI. Gen: WD/WN, NAD Head: /AT, No temporalis wasting.  Ear/Nose/Throat: Hearing grossly intact, nares w/o erythema or drainage Eyes: PER, EOMI, sclera nonicteric.  Neck: Supple, no masses.  No bruit or JVD.  Pulmonary:  Good air movement, no audible wheezing, no use of accessory muscles.  Cardiac: RRR, normal S1, S2, no Murmurs. Vascular:  mild trophic changes, no open wounds Vessel Right Left  Radial Palpable Palpable  PT Not Palpable Not Palpable  DP Not Palpable Not Palpable   Gastrointestinal: soft, non-distended. No guarding/no peritoneal signs.  Musculoskeletal: M/S 5/5 throughout.  No visible deformity.  Neurologic: CN 2-12 intact. Pain and light touch intact in extremities.  Symmetrical.  Speech is fluent. Motor exam as listed above. Psychiatric: Judgment intact, Mood & affect appropriate for pt's clinical situation. Dermatologic: No rashes or ulcers noted.  No changes consistent with cellulitis.   CBC Lab Results  Component Value Date   WBC 8.1 10/11/2023   HGB 9.6 (L) 10/11/2023   HCT 29.9 (L) 10/11/2023   MCV 87.7 10/11/2023   PLT 273 10/11/2023    BMET    Component Value Date/Time   NA 134 (L) 10/11/2023 0501   K 3.6 10/11/2023 0501   K 4.0 08/10/2012 1158   CL 99 10/11/2023 0501   CO2 25 10/11/2023 0501   GLUCOSE 111 (H) 10/11/2023 0501   BUN 20 10/11/2023 0501   CREATININE 1.03 10/12/2023 0607   CALCIUM 8.5 (L) 10/11/2023 0501   GFRNONAA >60 10/12/2023 0607   GFRAA >60 08/17/2015 1442   CrCl cannot be calculated (Patient's most recent lab result is older than the maximum 21 days allowed.).  COAG No results found for: "INR", "PROTIME"  Radiology No results found.   Assessment/Plan There are no diagnoses linked to this encounter.   Levora Dredge, MD  12/24/2023 3:49 PM

## 2023-12-26 ENCOUNTER — Encounter (INDEPENDENT_AMBULATORY_CARE_PROVIDER_SITE_OTHER): Payer: Self-pay | Admitting: Vascular Surgery

## 2023-12-26 ENCOUNTER — Ambulatory Visit (INDEPENDENT_AMBULATORY_CARE_PROVIDER_SITE_OTHER): Payer: Medicare PPO

## 2023-12-26 ENCOUNTER — Ambulatory Visit (INDEPENDENT_AMBULATORY_CARE_PROVIDER_SITE_OTHER): Payer: Medicare Other | Admitting: Vascular Surgery

## 2023-12-26 VITALS — BP 156/70 | HR 50 | Resp 16 | Wt 206.2 lb

## 2023-12-26 DIAGNOSIS — I7143 Infrarenal abdominal aortic aneurysm, without rupture: Secondary | ICD-10-CM

## 2023-12-26 DIAGNOSIS — I10 Essential (primary) hypertension: Secondary | ICD-10-CM | POA: Diagnosis not present

## 2023-12-26 DIAGNOSIS — I482 Chronic atrial fibrillation, unspecified: Secondary | ICD-10-CM | POA: Diagnosis not present

## 2023-12-26 DIAGNOSIS — E119 Type 2 diabetes mellitus without complications: Secondary | ICD-10-CM | POA: Diagnosis not present

## 2023-12-27 ENCOUNTER — Encounter (INDEPENDENT_AMBULATORY_CARE_PROVIDER_SITE_OTHER): Payer: Self-pay | Admitting: Vascular Surgery

## 2024-01-02 ENCOUNTER — Other Ambulatory Visit: Payer: Self-pay | Admitting: Family Medicine

## 2024-01-02 ENCOUNTER — Inpatient Hospital Stay
Admission: RE | Admit: 2024-01-02 | Discharge: 2024-01-02 | Disposition: A | Discharge status: Home / Self Care | Payer: Self-pay | Source: Ambulatory Visit | Attending: Neurosurgery | Admitting: Neurosurgery

## 2024-01-02 DIAGNOSIS — Z049 Encounter for examination and observation for unspecified reason: Secondary | ICD-10-CM

## 2024-01-04 NOTE — Progress Notes (Addendum)
 Referring Physician:  Denton Lank, FNP [id number] [place],  [place] [address]  Primary Physician:  Ivan Nurse, MD  History of Present Illness: 01/05/2024 [name] is here today with a chief complaint of bilateral numbness and weakness in legs and feet.  He mostly gets numbness in his feet when he stands.  Bending forward sometimes bothersome and causes back pain.  Movement also sometimes causes pain.  He is able to walk a good distance before he has to stop.  He still exercising regularly.  He has some pain, but numbness is his primary issue.  Bowel/Bladder Dysfunction: none  Conservative measures:  Physical therapy:  patient had 2-3 sessions at [facility] [place] and has been since then discharged Multimodal medical therapy including regular antiinflammatories:  none Injections:  11/15/2023: Bilateral S1 transforaminal ESI (50% relief)   Past Surgery: no spinal surgeries  Ivan Hering Sr. has no symptoms of cervical myelopathy.  The symptoms are causing a significant impact on the patient's life.   I have utilized the care everywhere function in epic to review the outside records available from external health systems.  Review of Systems:  A 10 point review of systems is negative, except for the pertinent positives and negatives detailed in the HPI.  Past Medical History: Past Medical History:  Diagnosis Date   AAA (abdominal aortic aneurysm) (HCC)    Arrhythmia    atrial fibrillation   B12 deficiency    CHF (congestive heart failure) (HCC)    Diabetes mellitus without complication (HCC)    Diverticulosis    Hypertension    Spinal stenosis    Stroke [facility])     Past Surgical History: Past Surgical History:  Procedure Laterality Date   BACK SURGERY     back infusion   CHOLECYSTECTOMY      Allergies: Allergies as of 01/05/2024 - Review Complete 12/27/2023  Allergen Reaction Noted   Clindamycin/lincomycin Other (See Comments)  08/17/2015   Enalapril maleate  06/12/2012   Erythromycin Other (See Comments) 08/17/2015   Nitrofurantoin  03/26/2014   Sulfa antibiotics Other (See Comments) 08/17/2015   Penicillins Rash 08/17/2015   Phenobarbital Rash 01/30/2007   Phenylbutazones Rash 08/17/2015    Medications:  Current Outpatient Medications:    apixaban (ELIQUIS) 5 MG TABS tablet, Take 1 tablet (5 mg total) by mouth 2 (two) times daily., Disp: 60 tablet, Rfl: 5   furosemide (LASIX) 40 MG tablet, Take 1 tablet (40 mg total) by mouth daily., Disp: 30 tablet, Rfl: 1   hydrocortisone 2.5 % cream, Apply 1 application. topically 2 (two) times daily as needed (itching)., Disp: , Rfl:    losartan (COZAAR) 100 MG tablet, Take 100 mg by mouth daily., Disp: , Rfl:    magnesium oxide (MAG-OX) 400 (240 Mg) MG tablet, Take 1 tablet by mouth daily., Disp: , Rfl:    Omega-3 Fatty Acids (FISH OIL) 1000 MG CAPS, Take 1 capsule by mouth 2 (two) times daily., Disp: , Rfl:    omeprazole (PRILOSEC) 40 MG capsule, Take 40 mg by mouth daily., Disp: , Rfl:    vitamin B-12 (CYANOCOBALAMIN) 1000 MCG tablet, Take 1,000 mcg by mouth daily., Disp: , Rfl:   Social History: Social History   Tobacco Use   Smoking status: Former   Smokeless tobacco: Never  Substance Use Topics   Alcohol use: Yes   Drug use: Never    Family Medical History: Family History  Problem Relation Age of Onset  Cancer Mother    Heart attack Father    Cancer Brother    Heart attack Brother    Heart Problems Son     Physical Examination: There were no vitals filed for this visit.  General: Patient is in no apparent distress. Attention to examination is appropriate.  Neck:   Supple.  Full range of motion.  Respiratory: Patient is breathing without any difficulty.   NEUROLOGICAL:     Awake, alert, oriented to person, place, and time.  Speech is clear and fluent.   Cranial Nerves: Pupils equal round and reactive to light.  Facial tone is symmetric.   Facial sensation is symmetric. Shoulder shrug is symmetric. Tongue protrusion is midline.  There is no pronator drift.  Strength: Side Biceps Triceps Deltoid Interossei Grip Wrist Ext. Wrist Flex.  R 5 5 5 5 5 5 5   L 5 5 5 5 5 5 5    Side Iliopsoas Quads Hamstring PF DF EHL  R 5 5 5 5 5 5   L 5 5 5 5 5 5    Reflexes are 1+ and symmetric at the biceps, triceps, brachioradialis, patella and achilles.   Hoffman's is absent.   Bilateral upper and lower extremity sensation is intact to light touch.    No evidence of dysmetria noted.  Gait is normal.     Medical Decision Making  Imaging: MRI L spine 10/22/2023 IMPRESSION: 1. Generalized lumbar spine degeneration most notable at L4-5 where there is anterolisthesis with advanced spinal canal and bilateral foraminal impingement. 2. L3-4 mild to moderate spinal stenosis and moderate right foraminal narrowing.     Electronically Signed   By: Tiburcio Pea M.D.   On: 10/22/2023 11:18  Xrays 10/20/2023 with flex/extension There is an anterolisthesis of L4 and L5 which changes from 2 mm in extension to 6 mm in flexion  I have personally reviewed the images and agree with the above interpretation.  Assessment and Plan: Ivan Lewis is a pleasant 89 y.o. male with spondylolisthesis at L4-5 which is mobile in nature.  He has evidence of instability.  He has severe spinal stenosis at this level.  He has tried physical therapy with mild improvement.  Currently, his symptoms are not causing much impact on his day-to-day life.  I have recommended that he continue to monitor his symptoms.  I will see him back in 2 months.   Thank you for involving me in the care of this patient.      Ivan Lewis K. Myer Haff MD, [facility] Neurosurgery

## 2024-01-05 ENCOUNTER — Encounter: Payer: Self-pay | Admitting: Neurosurgery

## 2024-01-05 ENCOUNTER — Ambulatory Visit: Payer: Medicare PPO | Admitting: Neurosurgery

## 2024-01-05 VITALS — BP 174/62 | Ht 68.0 in | Wt 205.0 lb

## 2024-01-05 DIAGNOSIS — M4316 Spondylolisthesis, lumbar region: Secondary | ICD-10-CM | POA: Diagnosis not present

## 2024-01-05 DIAGNOSIS — M532X6 Spinal instabilities, lumbar region: Secondary | ICD-10-CM | POA: Diagnosis not present

## 2024-01-05 DIAGNOSIS — M48062 Spinal stenosis, lumbar region with neurogenic claudication: Secondary | ICD-10-CM | POA: Diagnosis not present

## 2024-02-08 IMAGING — DX DG CHEST 1V PORT
1 series · 1 of 1 positions shown · non-contrast
Comparison: Two-view chest x-ray 08/17/2015

CLINICAL DATA: Shortness of breath.

EXAM:
PORTABLE CHEST 1 VIEW

[chest ap]
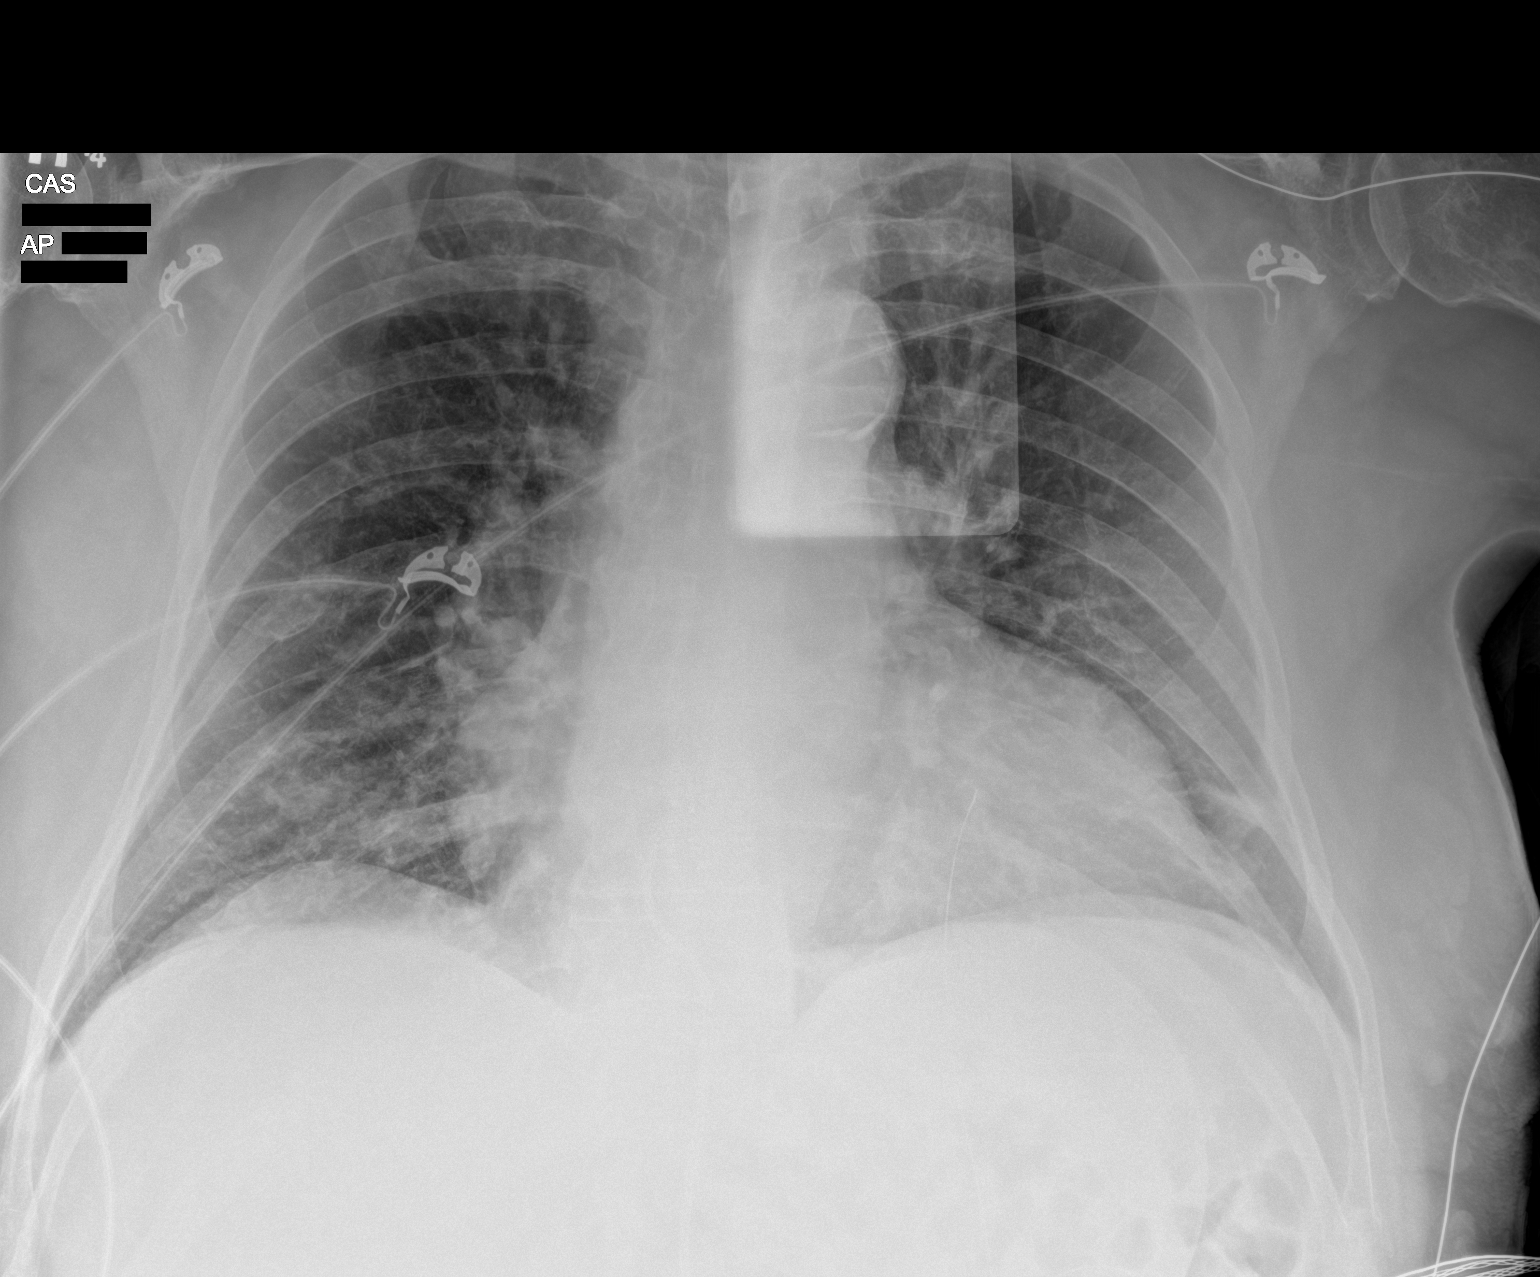

[1 of 1 positions shown; findings below may reference images not displayed]

FINDINGS: Heart is enlarged. Atherosclerotic changes are present at the aortic
arch. Patchy interstitial and airspace opacities are present
bilaterally. No significant consolidation is present. No significant
effusion or pneumothorax is present. Degenerative changes are noted
in the shoulders.
IMPRESSION: 1. Patchy interstitial and airspace opacities bilaterally. This may
represent edema or infection. Viral pneumonia can demonstrate this
pattern.
2. Cardiomegaly without failure.
3. Aortic atherosclerosis.

## 2024-03-06 ENCOUNTER — Ambulatory Visit: Admitting: Neurosurgery

## 2024-03-08 ENCOUNTER — Encounter: Payer: Self-pay | Admitting: Neurosurgery

## 2024-03-08 ENCOUNTER — Ambulatory Visit (INDEPENDENT_AMBULATORY_CARE_PROVIDER_SITE_OTHER): Admitting: Neurosurgery

## 2024-03-08 VITALS — BP 134/60 | Ht 68.0 in | Wt 205.0 lb

## 2024-03-08 DIAGNOSIS — M48062 Spinal stenosis, lumbar region with neurogenic claudication: Secondary | ICD-10-CM

## 2024-03-08 DIAGNOSIS — M4316 Spondylolisthesis, lumbar region: Secondary | ICD-10-CM | POA: Diagnosis not present

## 2024-03-08 DIAGNOSIS — M532X6 Spinal instabilities, lumbar region: Secondary | ICD-10-CM

## 2024-03-08 NOTE — Progress Notes (Signed)
 Referring Physician:  No referring provider defined for this encounter.  Primary Physician:  Little Riff, MD  History of Present Illness: 03/08/2024 Mr. Geske has stable symptoms.  He is able to do what he would like.  He does not have any complaints today.  He does have some issues with walking long distances.  01/05/2024 Mr. Elie Mcelroy is here today with a chief complaint of bilateral numbness and weakness in legs and feet.  He mostly gets numbness in his feet when he stands.  Bending forward sometimes bothersome and causes back pain.  Movement also sometimes causes pain.  He is able to walk a good distance before he has to stop.  He still exercising regularly.  He has some pain, but numbness is his primary issue.  Bowel/Bladder Dysfunction: none  Conservative measures:  Physical therapy:  patient had 2-3 sessions at [facility] in [place] and has been since then discharged Multimodal medical therapy including regular antiinflammatories:  none Injections:  11/15/2023: Bilateral S1 transforaminal ESI (50% relief)   Past Surgery: no spinal surgeries  Laymond Priestly Sr. has no symptoms of cervical myelopathy.  The symptoms are causing a significant impact on the patient's life.   I have utilized the care everywhere function in epic to review the outside records available from external health systems.  Review of Systems:  A 10 point review of systems is negative, except for the pertinent positives and negatives detailed in the HPI.  Past Medical History: Past Medical History:  Diagnosis Date   AAA (abdominal aortic aneurysm) (HCC)    Arrhythmia    atrial fibrillation   B12 deficiency    CHF (congestive heart failure) (HCC)    Diabetes mellitus without complication (HCC)    Diverticulosis    Hypertension    Spinal stenosis    Stroke [facility])     Past Surgical History: Past Surgical History:  Procedure Laterality Date   BACK SURGERY     back infusion    CHOLECYSTECTOMY      Allergies: Allergies as of 03/08/2024 - Review Complete 03/08/2024  Allergen Reaction Noted   Clindamycin/lincomycin Other (See Comments) 08/17/2015   Enalapril maleate  06/12/2012   Erythromycin Other (See Comments) 08/17/2015   Nitrofurantoin  03/26/2014   Sulfa antibiotics Other (See Comments) 08/17/2015   Penicillins Rash 08/17/2015   Phenobarbital Rash 01/30/2007   Phenylbutazones Rash 08/17/2015    Medications:  Current Outpatient Medications:    apixaban  (ELIQUIS ) 5 MG TABS tablet, Take 1 tablet (5 mg total) by mouth 2 (two) times daily., Disp: 60 tablet, Rfl: 5   hydrocortisone  2.5 % cream, Apply 1 application. topically 2 (two) times daily as needed (itching)., Disp: , Rfl:    losartan  (COZAAR ) 100 MG tablet, Take 100 mg by mouth daily., Disp: , Rfl:    magnesium oxide (MAG-OX) 400 (240 Mg) MG tablet, Take 1 tablet by mouth daily., Disp: , Rfl:    Omega-3 Fatty Acids (FISH OIL) 1000 MG CAPS, Take 1 capsule by mouth 2 (two) times daily., Disp: , Rfl:    omeprazole (PRILOSEC) 40 MG capsule, Take 40 mg by mouth daily., Disp: , Rfl:    vitamin B-12 (CYANOCOBALAMIN ) 1000 MCG tablet, Take 1,000 mcg by mouth daily., Disp: , Rfl:    furosemide  (LASIX ) 40 MG tablet, Take 1 tablet (40 mg total) by mouth daily., Disp: 30 tablet, Rfl: 1  Social History: Social History   Tobacco Use   Smoking status: Former    Current  packs/day: 0.00    Types: Cigarettes    Quit date: 26    Years since quitting: 40.3   Smokeless tobacco: Never  Substance Use Topics   Alcohol use: Yes   Drug use: Never    Family Medical History: Family History  Problem Relation Age of Onset   Cancer Mother    Heart attack Father    Cancer Brother    Heart attack Brother    Heart Problems Son     Physical Examination: Vitals:   03/08/24 1518  BP: 134/60    General: Patient is in no apparent distress. Attention to examination is appropriate.  Neck:   Supple.  Full range of  motion.  Respiratory: Patient is breathing without any difficulty.   NEUROLOGICAL:     Awake, alert, oriented to person, place, and time.  Speech is clear and fluent.   Cranial Nerves: Pupils equal round and reactive to light.  Facial tone is symmetric.  Facial sensation is symmetric. Shoulder shrug is symmetric. Tongue protrusion is midline.  There is no pronator drift.  Strength: Side Biceps Triceps Deltoid Interossei Grip Wrist Ext. Wrist Flex.  R 5 5 5 5 5 5 5   L 5 5 5 5 5 5 5    Side Iliopsoas Quads Hamstring PF DF EHL  R 5 5 5 5 5 5   L 5 5 5 5 5 5    Reflexes are 1+ and symmetric at the biceps, triceps, brachioradialis, patella and achilles.   Hoffman's is absent.   Bilateral upper and lower extremity sensation is intact to light touch.    No evidence of dysmetria noted.  Gait is slow.     Medical Decision Making  Imaging: MRI L spine 10/22/2023 IMPRESSION: 1. Generalized lumbar spine degeneration most notable at L4-5 where there is anterolisthesis with advanced spinal canal and bilateral foraminal impingement. 2. L3-4 mild to moderate spinal stenosis and moderate right foraminal narrowing.     Electronically Signed   By: Ronnette Coke M.D.   On: 10/22/2023 11:18  Xrays 10/20/2023 with flex/extension There is an anterolisthesis of L4 and L5 which changes from 2 mm in extension to 6 mm in flexion  I have personally reviewed the images and agree with the above interpretation.  Assessment and Plan: Mr. Marchewka is a pleasant 89 y.o. male with spondylolisthesis at L4-5 which is mobile in nature.  He has evidence of instability.  He has severe spinal stenosis at this level.  He has tried physical therapy with mild improvement.  Currently, his symptoms are not causing much impact on his day-to-day life.  I have recommended that he continue to monitor his symptoms.  I will see him back as needed.  I spent a total of 10 minutes in this patient's care today. This  time was spent reviewing pertinent records including imaging studies, obtaining and confirming history, performing a directed evaluation, formulating and discussing my recommendations, and documenting the visit within the medical record.    Thank you for involving me in the care of this patient.      Addalee Kavanagh K. Mont Antis MD, [facility] Neurosurgery
